# Patient Record
Sex: Male | Born: 1965
Health system: Southern US, Community
[De-identification: ages and names within clinical notes are randomized; demographics above are authoritative.]

## PROBLEM LIST (undated history)

## (undated) DIAGNOSIS — K219 Gastro-esophageal reflux disease without esophagitis: Secondary | ICD-10-CM

## (undated) DIAGNOSIS — J45909 Unspecified asthma, uncomplicated: Secondary | ICD-10-CM

## (undated) DIAGNOSIS — F32A Depression, unspecified: Secondary | ICD-10-CM

## (undated) DIAGNOSIS — T7840XA Allergy, unspecified, initial encounter: Secondary | ICD-10-CM

## (undated) DIAGNOSIS — R569 Unspecified convulsions: Secondary | ICD-10-CM

## (undated) DIAGNOSIS — M109 Gout, unspecified: Secondary | ICD-10-CM

## (undated) DIAGNOSIS — I341 Nonrheumatic mitral (valve) prolapse: Secondary | ICD-10-CM

## (undated) DIAGNOSIS — M1990 Unspecified osteoarthritis, unspecified site: Secondary | ICD-10-CM

## (undated) DIAGNOSIS — E785 Hyperlipidemia, unspecified: Secondary | ICD-10-CM

## (undated) DIAGNOSIS — E119 Type 2 diabetes mellitus without complications: Secondary | ICD-10-CM

## (undated) DIAGNOSIS — I10 Essential (primary) hypertension: Secondary | ICD-10-CM

## (undated) DIAGNOSIS — F419 Anxiety disorder, unspecified: Secondary | ICD-10-CM

## (undated) DIAGNOSIS — C801 Malignant (primary) neoplasm, unspecified: Secondary | ICD-10-CM

## (undated) DIAGNOSIS — M549 Dorsalgia, unspecified: Secondary | ICD-10-CM

## (undated) HISTORY — PX: COLONOSCOPY: SHX174

## (undated) HISTORY — PX: KNEE SURGERY: SHX244

## (undated) HISTORY — PX: SHOULDER SURGERY: SHX246

## (undated) HISTORY — DX: Unspecified osteoarthritis, unspecified site: M19.90

## (undated) HISTORY — DX: Malignant (primary) neoplasm, unspecified: C80.1

## (undated) HISTORY — DX: Gastro-esophageal reflux disease without esophagitis: K21.9

## (undated) HISTORY — DX: Unspecified asthma, uncomplicated: J45.909

## (undated) HISTORY — DX: Essential (primary) hypertension: I10

## (undated) HISTORY — DX: Type 2 diabetes mellitus without complications: E11.9

## (undated) HISTORY — PX: BACK SURGERY: SHX140

## (undated) HISTORY — DX: Hyperlipidemia, unspecified: E78.5

## (undated) HISTORY — DX: Unspecified convulsions: R56.9

## (undated) HISTORY — DX: Gout, unspecified: M10.9

## (undated) HISTORY — DX: Allergy, unspecified, initial encounter: T78.40XA

## (undated) HISTORY — PX: POLYPECTOMY: SHX149

## (undated) HISTORY — DX: Anxiety disorder, unspecified: F41.9

## (undated) HISTORY — DX: Depression, unspecified: F32.A

---

## 1999-03-10 ENCOUNTER — Encounter: Payer: Self-pay | Admitting: Emergency Medicine

## 1999-03-10 ENCOUNTER — Emergency Department (HOSPITAL_COMMUNITY): Admission: EM | Admit: 1999-03-10 | Discharge: 1999-03-10 | Payer: Self-pay | Admitting: Emergency Medicine

## 1999-11-11 ENCOUNTER — Encounter: Payer: Self-pay | Admitting: Emergency Medicine

## 1999-11-11 ENCOUNTER — Emergency Department (HOSPITAL_COMMUNITY): Admission: EM | Admit: 1999-11-11 | Discharge: 1999-11-11 | Payer: Self-pay | Admitting: Emergency Medicine

## 2001-05-24 ENCOUNTER — Emergency Department (HOSPITAL_COMMUNITY): Admission: EM | Admit: 2001-05-24 | Discharge: 2001-05-24 | Payer: Self-pay

## 2001-10-01 ENCOUNTER — Ambulatory Visit (HOSPITAL_COMMUNITY): Admission: RE | Admit: 2001-10-01 | Discharge: 2001-10-01 | Payer: Self-pay | Admitting: Family Medicine

## 2001-10-01 ENCOUNTER — Encounter: Payer: Self-pay | Admitting: Family Medicine

## 2003-01-29 ENCOUNTER — Ambulatory Visit (HOSPITAL_COMMUNITY): Admission: RE | Admit: 2003-01-29 | Discharge: 2003-01-29 | Payer: Self-pay | Admitting: Neurosurgery

## 2003-05-17 ENCOUNTER — Emergency Department (HOSPITAL_COMMUNITY): Admission: EM | Admit: 2003-05-17 | Discharge: 2003-05-17 | Payer: Self-pay | Admitting: *Deleted

## 2003-10-02 ENCOUNTER — Emergency Department (HOSPITAL_COMMUNITY): Admission: EM | Admit: 2003-10-02 | Discharge: 2003-10-02 | Payer: Self-pay | Admitting: Emergency Medicine

## 2003-10-12 ENCOUNTER — Emergency Department (HOSPITAL_COMMUNITY): Admission: EM | Admit: 2003-10-12 | Discharge: 2003-10-12 | Payer: Self-pay | Admitting: Emergency Medicine

## 2004-02-26 ENCOUNTER — Emergency Department (HOSPITAL_COMMUNITY): Admission: EM | Admit: 2004-02-26 | Discharge: 2004-02-26 | Payer: Self-pay | Admitting: *Deleted

## 2004-03-17 ENCOUNTER — Encounter: Admission: RE | Admit: 2004-03-17 | Discharge: 2004-03-17 | Payer: Self-pay | Admitting: Family Medicine

## 2004-07-12 ENCOUNTER — Ambulatory Visit (HOSPITAL_COMMUNITY): Admission: RE | Admit: 2004-07-12 | Discharge: 2004-07-12 | Payer: Self-pay | Admitting: Anesthesiology

## 2004-09-07 ENCOUNTER — Emergency Department (HOSPITAL_COMMUNITY): Admission: EM | Admit: 2004-09-07 | Discharge: 2004-09-07 | Payer: Self-pay | Admitting: Emergency Medicine

## 2004-11-20 ENCOUNTER — Ambulatory Visit (HOSPITAL_COMMUNITY): Admission: RE | Admit: 2004-11-20 | Discharge: 2004-11-20 | Payer: Self-pay | Admitting: Neurosurgery

## 2004-11-30 ENCOUNTER — Encounter: Admission: RE | Admit: 2004-11-30 | Discharge: 2004-11-30 | Payer: Self-pay | Admitting: Neurosurgery

## 2004-12-18 ENCOUNTER — Ambulatory Visit (HOSPITAL_COMMUNITY): Admission: RE | Admit: 2004-12-18 | Discharge: 2004-12-19 | Payer: Self-pay | Admitting: Neurosurgery

## 2007-05-26 ENCOUNTER — Ambulatory Visit (HOSPITAL_COMMUNITY): Admission: RE | Admit: 2007-05-26 | Discharge: 2007-05-26 | Payer: Self-pay | Admitting: Anesthesiology

## 2007-06-28 ENCOUNTER — Encounter: Admission: RE | Admit: 2007-06-28 | Discharge: 2007-06-28 | Payer: Self-pay | Admitting: Neurosurgery

## 2007-11-13 ENCOUNTER — Inpatient Hospital Stay (HOSPITAL_COMMUNITY): Admission: RE | Admit: 2007-11-13 | Discharge: 2007-11-15 | Payer: Self-pay | Admitting: Neurosurgery

## 2008-03-10 ENCOUNTER — Encounter: Admission: RE | Admit: 2008-03-10 | Discharge: 2008-03-10 | Payer: Self-pay | Admitting: Neurosurgery

## 2010-03-25 ENCOUNTER — Encounter: Payer: Self-pay | Admitting: Neurosurgery

## 2010-03-26 ENCOUNTER — Encounter: Payer: Self-pay | Admitting: Orthopedic Surgery

## 2010-06-21 ENCOUNTER — Emergency Department (HOSPITAL_COMMUNITY): Payer: BC Managed Care – PPO

## 2010-06-21 ENCOUNTER — Emergency Department (HOSPITAL_COMMUNITY)
Admission: EM | Admit: 2010-06-21 | Discharge: 2010-06-22 | Disposition: A | Discharge status: Home / Self Care | Payer: BC Managed Care – PPO | Attending: Emergency Medicine | Admitting: Emergency Medicine

## 2010-06-21 DIAGNOSIS — F29 Unspecified psychosis not due to a substance or known physiological condition: Secondary | ICD-10-CM | POA: Insufficient documentation

## 2010-06-21 DIAGNOSIS — S0990XA Unspecified injury of head, initial encounter: Secondary | ICD-10-CM | POA: Insufficient documentation

## 2010-06-21 DIAGNOSIS — T148XXA Other injury of unspecified body region, initial encounter: Secondary | ICD-10-CM | POA: Insufficient documentation

## 2010-06-21 DIAGNOSIS — R296 Repeated falls: Secondary | ICD-10-CM | POA: Insufficient documentation

## 2010-06-21 DIAGNOSIS — M542 Cervicalgia: Secondary | ICD-10-CM | POA: Insufficient documentation

## 2010-06-21 DIAGNOSIS — IMO0002 Reserved for concepts with insufficient information to code with codable children: Secondary | ICD-10-CM | POA: Insufficient documentation

## 2010-06-21 DIAGNOSIS — M545 Low back pain, unspecified: Secondary | ICD-10-CM | POA: Insufficient documentation

## 2010-06-21 DIAGNOSIS — Y92009 Unspecified place in unspecified non-institutional (private) residence as the place of occurrence of the external cause: Secondary | ICD-10-CM | POA: Insufficient documentation

## 2010-06-21 DIAGNOSIS — F3289 Other specified depressive episodes: Secondary | ICD-10-CM | POA: Insufficient documentation

## 2010-06-21 DIAGNOSIS — R55 Syncope and collapse: Secondary | ICD-10-CM | POA: Insufficient documentation

## 2010-06-21 DIAGNOSIS — F329 Major depressive disorder, single episode, unspecified: Secondary | ICD-10-CM | POA: Insufficient documentation

## 2010-06-21 DIAGNOSIS — R51 Headache: Secondary | ICD-10-CM | POA: Insufficient documentation

## 2010-06-21 LAB — URINALYSIS, ROUTINE W REFLEX MICROSCOPIC: Protein, ur: NEGATIVE mg/dL

## 2010-06-21 LAB — D-DIMER, QUANTITATIVE: D-Dimer, Quant: <0.22 ug/mL-FEU (ref 0.00–0.48)

## 2010-06-21 LAB — DIFFERENTIAL
Basophils Absolute: 0 10*3/uL (ref 0.0–0.1)
Basophils Relative: 0 % (ref 0–1)
Eosinophils Absolute: 0.1 10*3/uL (ref 0.0–0.7)
Monocytes Absolute: 0.9 10*3/uL (ref 0.1–1.0)
Monocytes Relative: 9 % (ref 3–12)

## 2010-06-21 LAB — POCT CARDIAC MARKERS
CKMB, poc: <1 ng/mL — ABNORMAL LOW (ref 1.0–8.0)
Myoglobin, poc: 101 ng/mL (ref 12–200)
Troponin i, poc: <0.05 ng/mL (ref 0.00–0.09)

## 2010-06-21 LAB — BASIC METABOLIC PANEL
Chloride: 102 mEq/L (ref 96–112)
Potassium: 3.9 mEq/L (ref 3.5–5.1)
Sodium: 139 mEq/L (ref 135–145)

## 2010-06-21 LAB — CBC
Hemoglobin: 14.8 g/dL (ref 13.0–17.0)
MCV: 86.2 fL (ref 78.0–100.0)
Platelets: 269 10*3/uL (ref 150–400)

## 2010-06-21 LAB — ETHANOL: Alcohol, Ethyl (B): <5 mg/dL (ref 0–10)

## 2010-06-23 LAB — URINE CULTURE
Colony Count: NO GROWTH
Culture: NO GROWTH

## 2010-07-17 NOTE — Op Note (Signed)
NAME:  Robert Wilkerson, Robert Wilkerson NO.:  [id number]   MEDICAL RECORD NO.:  [id number]          PATIENT TYPE:  INP   LOCATION:  3030                         FACILITY:  [facility]   PHYSICIAN:  Kathaleen Maser. Pool, M.D.    DATE OF BIRTH:  [birth year]   DATE OF PROCEDURE:  DATE OF DISCHARGE:                               OPERATIVE REPORT   PREOPERATIVE DIAGNOSIS:  L5-S1 degenerative disk disease with foraminal  stenosis.   POSTOPERATIVE DIAGNOSIS:  L5-S1 degenerative disk disease with foraminal  stenosis.   PROCEDURES NOTE:  L5-S1 decompressive laminectomy and L5-S1  decompressive foraminotomies, more than will be required for simple  interbody fusion alone.  L5-S1 posterior lumbar body fusion utilizing  tangent interbody allograft wedge, Telamon interbody PEEK cage and local  autografting.  L5-S1 posterolateral arthrodesis utilizing nonsegmental  pedicle screw instrumentation and local autografting.   SURGEON:  Kathaleen Maser. Pool, MD   ASSISTANT:  Donalee Citrin, MD   ANESTHESIA:  General endotracheal.   HISTORY:  Robert Wilkerson is a 45 year old male with a history of severe back  and right lower extremity pain failing all conservative management.  Workup demonstrates evidence of significant disk degeneration and facet  arthropathy with marked right-sided L5-S1 foraminal stenosis.  The  patient has been counseled as to his options.  As any type of  decompressive procedure would be destabilizing further, we have decided  to proceed with both decompression and fusion at the L5-S1 level in  hopes of improving his symptoms.   OPERATIVE NOTE:  The patient was brought to the operating room, placed  on operating table in a supine position.  After an adequate level of  anesthesia achieved, the patient was placed prone on to [place] frame.  Appropriately padded the patient's lumbar region, prepped and draped  sterilely.  A #10 blade was used to make a curvilinear skin incision  overlying the L5-S1 levels.   This was carried down sharply in midline.  Subperiosteal dissection was then performed exposing the lamina and  facet joints at L5-S1 as well as transverse processes of L5-S1.  Deep  self-retaining retractor was placed.  Intraoperative x-ray was taken and  the level was confirmed.  Decompressive laminectomy was then performed  using Leksell rongeurs, Kerrison rongeurs, and high-speed drill to  remove the entire lamina of L5, entire inferior facet of L5, and  superior facet of S1 bilaterally.  Partial superior laminectomy of S1  was also performed.  All bone was cleaned using later autografting.  Ligamentum flavum was then elevated and resected in the usual fashion  using Kerrison rongeurs.  Wide decompressive foraminotomies were  completed along the course of the exiting L5-S1 nerve roots bilaterally  with particular attention being placed on the right-sided L5 foramen.  Epidural venous plexus was coagulated and cut.  A distractor was left in  the patient's left side.  Thecal sac and nerve roots were protected.  Disk space was incised and diskectomy was performed.  The procedure was  then repeated on the contralateral side.  Disk space was then  sequentially dilated up to 8 mm, and  an 8-mm distractor was left in the  patient's left side.  Thecal sac and nerve roots were protected on the  right side.  Disk space was then reamed and then cut with 8-mm tangent  instruments.  Soft tissue was then removed from the interspace.  An 8 x  26 mm Telamon cage packed with morcelized autograft was then packed into  place and recessed approximately 3 mm from the posterior cortical margin  of L5.  Distractor was removed from the patient's left side.  Thecal sac  and nerve roots were protected in the left side.  Disk space was then  reamed and cut with an 8-mm tangent instruments.  Soft tissue was then  removed from the interspace.  Disk space was further curettaged.  Morcelized autograft was then packed  in the interspace.  An 8 x 26 mm  tangent wedge was then packed on the left side and recessed  approximately 2 mm from the posterior cortical margin of L5.  Pedicles  at L5-S1 were then identified using surface landmarks and intraoperative  fluoroscopy.  Superficial bone overlying the pedicle was then removed  using high-speed drill.  Each pedicle was then probed using pedicle awl.  Pedicle awl track was then tapped with a 5.25-mm screw tapper.  Each  screw tap hole was probed and found to be solid within bone.  A 6.75 x  45 mm radius screws were placed bilaterally at L5.  A 6.75 x 35 mm  screws were placed bilaterally at S1.  Transverse processes and sacral  ala were then decorticated using high-speed drill.  Morcelized autograft  was packed posterolaterally for later fusion.  Short-segment titanium  rods were then placed through the screw holes at L5-S1.  Locking caps  were placed through the screw.  Locking caps were then engaged and were  then constructed with compression.  Final images revealed good position  of bone grafts and hardware with proper operative level and normal  alignment of spine.  Wound was then irrigated one final time.  Hemostasis was ensured with the bipolar electrocautery.  Gelfoam was  placed topically over the epidural space.  Medium Hemovac drain was left  at operative sites.  Wound was then closed in layers with Vicryl suture.  Steri-Strips and sterile dressings were applied.  There were no  complications.  He tolerated the procedure well and he returns to  recovery room postoperatively.           ______________________________  Kathaleen Maser Pool, M.D.     HAP/MEDQ  D:  11/13/2007  T:  11/14/2007  Job:  [id number]

## 2010-12-05 LAB — BASIC METABOLIC PANEL
Calcium: 9.8
GFR calc Af Amer: >60
GFR calc non Af Amer: >60
Potassium: 4.5
Sodium: 137

## 2010-12-05 LAB — DIFFERENTIAL
Basophils Absolute: 0
Eosinophils Relative: 0
Lymphocytes Relative: 24
Lymphs Abs: 2
Monocytes Absolute: 0.6
Neutro Abs: 5.6

## 2010-12-05 LAB — CBC
HCT: 43.2
Hemoglobin: 14.5
RDW: 12.5
WBC: 8.3

## 2010-12-05 LAB — ABO/RH: ABO/RH(D): B POS

## 2010-12-05 LAB — TYPE AND SCREEN: ABO/RH(D): B POS

## 2011-01-23 ENCOUNTER — Emergency Department (HOSPITAL_COMMUNITY)
Admission: EM | Admit: 2011-01-23 | Discharge: 2011-01-24 | Disposition: A | Discharge status: Home / Self Care | Payer: BC Managed Care – PPO | Attending: Emergency Medicine | Admitting: Emergency Medicine

## 2011-01-23 ENCOUNTER — Encounter: Payer: Self-pay | Admitting: Emergency Medicine

## 2011-01-23 ENCOUNTER — Emergency Department (HOSPITAL_COMMUNITY): Payer: BC Managed Care – PPO

## 2011-01-23 DIAGNOSIS — R609 Edema, unspecified: Secondary | ICD-10-CM | POA: Insufficient documentation

## 2011-01-23 DIAGNOSIS — S93409A Sprain of unspecified ligament of unspecified ankle, initial encounter: Secondary | ICD-10-CM | POA: Insufficient documentation

## 2011-01-23 DIAGNOSIS — S93402A Sprain of unspecified ligament of left ankle, initial encounter: Secondary | ICD-10-CM

## 2011-01-23 DIAGNOSIS — X500XXA Overexertion from strenuous movement or load, initial encounter: Secondary | ICD-10-CM | POA: Insufficient documentation

## 2011-01-23 DIAGNOSIS — M25579 Pain in unspecified ankle and joints of unspecified foot: Secondary | ICD-10-CM | POA: Insufficient documentation

## 2011-01-23 MED ORDER — HYDROCODONE-ACETAMINOPHEN 5-325 MG PO TABS: 1.0000 | ORAL_TABLET | ORAL | Status: AC | PRN

## 2011-01-23 MED ORDER — IBUPROFEN 800 MG PO TABS: 800.0000 mg | ORAL_TABLET | Freq: Three times a day (TID) | ORAL | Status: AC

## 2011-01-23 MED ORDER — HYDROCODONE-ACETAMINOPHEN 5-325 MG PO TABS
1.0000 | ORAL_TABLET | Freq: Once | ORAL | Status: AC
  Administered 2011-01-23: 1 via ORAL
  Filled 2011-01-23: qty 1

## 2011-01-23 NOTE — ED Notes (Signed)
Pt states he was hunting today.  Was walking down a trail and didn't see a rut that he stepped into.  Rolled his right ankle.  Minor swelling/bruising noted.

## 2011-01-23 NOTE — ED Provider Notes (Signed)
History     CSN: [id number] Arrival date & time: 01/23/2011  9:54 PM   First MD Initiated Contact with Patient 01/23/11 2312      Chief Complaint  Patient presents with  . Ankle Pain    right     (Consider location/radiation/quality/duration/timing/severity/associated sxs/prior treatment) HPI History provided by pt.   Pt stepped in a ditch while hunting today and foot inverted.  Has 4/10 pain at lateral malleolus and is unable to bear weight.  No paresthesias. H/o avulsion injury of this ankle in past.    History reviewed. No pertinent past medical history.  Past Surgical History  Procedure Date  . Knee surgery   . Shoulder surgery   . Back surgery     History reviewed. No pertinent family history.  History  Substance Use Topics  . Smoking status: Never Smoker   . Smokeless tobacco: Current User  . Alcohol Use: No      Review of Systems  All other systems reviewed and are negative.    Allergies  Penicillins and Sulfa antibiotics  Home Medications   Current Outpatient Rx  Name Route Sig Dispense Refill  . ACETAMINOPHEN 500 MG PO TABS Oral Take 1,000 mg by mouth every 6 (six) hours as needed. PAIN.     Marland Kitchen AMINOCAPROIC ACID 500 MG PO TABS Oral Take 1 g by mouth daily.      . DESVENLAFAXINE SUCCINATE 50 MG PO TB24 Oral Take 50 mg by mouth daily.      Carma Leaven M PLUS PO TABS Oral Take 1 tablet by mouth daily.      Marland Kitchen NAPROXEN SODIUM 220 MG PO TABS Oral Take 220 mg by mouth daily as needed. PAIN/       BP 131/82  Pulse 100  Temp(Src) 98 F (36.7 C) (Oral)  Resp 16  Ht 5\' 10"  (1.778 m)  Wt 216 lb (97.977 kg)  BMI 30.99 kg/m2  SpO2 96%  Physical Exam  Nursing note and vitals reviewed. Constitutional: He is oriented to person, place, and time. He appears well-developed and well-nourished. No distress.  HENT:  Head: Normocephalic and atraumatic.  Eyes:       Normal appearance  Neck: Normal range of motion.  Musculoskeletal:       Edema and tenderness  isolated to right lateral malleolus.  No ecchymosis or other skin changes.  Pain w/ flexion/extension and esp foot inversion/lat. Rotation.  2+ DP pulse.  Distal sensation intact.   Neurological: He is alert and oriented to person, place, and time.  Psychiatric: He has a normal mood and affect. His behavior is normal.    ED Course  Procedures (including critical care time)  Labs Reviewed - No data to display Dg Ankle Complete Right  01/23/2011  *RADIOLOGY REPORT*  Clinical Data: Larey Seat and injured right ankle.  RIGHT ANKLE - COMPLETE 3+ VIEW 01/23/2011:  Comparison: None.  Findings: No evidence of acute fracture or dislocation.  Well corticated ossific density adjacent to the tip of the lateral malleolus.  Ankle mortise intact with well-preserved joint space. Well-preserved bone mineral density.  No visible joint effusion.  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  No acute or significant abnormality.  Original Report Authenticated By: Arnell Sieving, M.D.     1. Sprain of left ankle       MDM  Pt presents w/ inversion injury right ankle.  Xray neg for fx/disclocation.  Results discussed w/ pt.  Will treat conservatively for sprain.  Ortho tech placed in ASO and provided pt w/ crutches.  D/c'd home w/ vicodin/ibuprofen and referral to ortho for persistent/worsening sx.          Otilio Miu, PA 01/24/11 0019  Otilio Miu, PA 01/24/11 [phone]

## 2011-01-23 NOTE — ED Notes (Signed)
Pt states he was hunting today when he "stepped on a rut" and rolled his right ankle.  Reports swelling/bruising.

## 2011-01-23 NOTE — ED Notes (Signed)
Patient transported to X-ray 

## 2011-01-24 NOTE — ED Provider Notes (Signed)
Medical screening examination/treatment/procedure(s) were performed by non-physician practitioner and as supervising physician I was immediately available for consultation/collaboration.    Nelia Shi, MD 01/24/11 [phone]

## 2011-07-21 ENCOUNTER — Encounter (HOSPITAL_COMMUNITY): Payer: Self-pay | Admitting: Family Medicine

## 2011-07-21 ENCOUNTER — Emergency Department (HOSPITAL_COMMUNITY)
Admission: EM | Admit: 2011-07-21 | Discharge: 2011-07-21 | Disposition: A | Discharge status: Home / Self Care | Payer: BC Managed Care – PPO | Attending: Emergency Medicine | Admitting: Emergency Medicine

## 2011-07-21 DIAGNOSIS — IMO0002 Reserved for concepts with insufficient information to code with codable children: Secondary | ICD-10-CM | POA: Insufficient documentation

## 2011-07-21 DIAGNOSIS — M549 Dorsalgia, unspecified: Secondary | ICD-10-CM | POA: Insufficient documentation

## 2011-07-21 DIAGNOSIS — X58XXXA Exposure to other specified factors, initial encounter: Secondary | ICD-10-CM | POA: Insufficient documentation

## 2011-07-21 DIAGNOSIS — S39012A Strain of muscle, fascia and tendon of lower back, initial encounter: Secondary | ICD-10-CM

## 2011-07-21 HISTORY — DX: Nonrheumatic mitral (valve) prolapse: I34.1

## 2011-07-21 HISTORY — DX: Dorsalgia, unspecified: M54.9

## 2011-07-21 MED ORDER — SODIUM CHLORIDE 0.9 % IV BOLUS (SEPSIS)
1000.0000 mL | Freq: Once | INTRAVENOUS | Status: AC
  Administered 2011-07-21: 1000 mL via INTRAVENOUS

## 2011-07-21 MED ORDER — DIAZEPAM 5 MG PO TABS
5.0000 mg | ORAL_TABLET | Freq: Once | ORAL | Status: AC
  Administered 2011-07-21: 5 mg via ORAL
  Filled 2011-07-21: qty 1

## 2011-07-21 MED ORDER — HYDROCODONE-ACETAMINOPHEN 5-325 MG PO TABS: 1.0000 | ORAL_TABLET | Freq: Four times a day (QID) | ORAL | Status: AC | PRN

## 2011-07-21 MED ORDER — HYDROCODONE-ACETAMINOPHEN 5-325 MG PO TABS
1.0000 | ORAL_TABLET | Freq: Once | ORAL | Status: AC
  Administered 2011-07-21: 1 via ORAL
  Filled 2011-07-21: qty 1

## 2011-07-21 MED ORDER — IBUPROFEN 800 MG PO TABS: 800.0000 mg | ORAL_TABLET | Freq: Three times a day (TID) | ORAL | Status: AC

## 2011-07-21 MED ORDER — HYDROMORPHONE HCL PF 1 MG/ML IJ SOLN
1.0000 mg | Freq: Once | INTRAMUSCULAR | Status: AC
  Administered 2011-07-21: 1 mg via INTRAVENOUS
  Filled 2011-07-21: qty 1

## 2011-07-21 MED ORDER — IBUPROFEN 800 MG PO TABS
800.0000 mg | ORAL_TABLET | Freq: Once | ORAL | Status: AC
Start: 2011-07-21 — End: 2011-07-21
  Administered 2011-07-21: 800 mg via ORAL
  Filled 2011-07-21: qty 1

## 2011-07-21 MED ORDER — DIAZEPAM 5 MG PO TABS: 5.0000 mg | ORAL_TABLET | Freq: Three times a day (TID) | ORAL | Status: AC | PRN

## 2011-07-21 NOTE — ED Notes (Signed)
Per pt was doing back exercises a few days ago and pulled his back. sts mid back pain radiating down right leg. Hx of back pain.

## 2011-07-21 NOTE — ED Provider Notes (Signed)
History  Scribed for Gerhard Munch, MD, the patient was seen in room STRE8/STRE8. This chart was scribed by Candelaria Stagers. The patient's care started at 1:47 PM    CSN: [id number]  Arrival date & time 07/21/11  1315   None     Chief Complaint  Patient presents with  . Back Pain    Back Pain This is a recurrent problem. The current episode started in the past 7 days. The problem occurs constantly. The problem is unchanged. The pain is present in the lumbar spine. The pain radiates to the right thigh. The pain is moderate. The symptoms are aggravated by lying down and standing. Stiffness is present all day. Pertinent negatives include no fever or weakness. He has tried nothing for the symptoms. The treatment provided no relief.   Robert Wilkerson is a 46 y.o. male who presents to the Emergency Department complaining of lower mid back pain that radiates down the back of his right leg to the knee after aggravating it two days ago.  Pt has had previous back surgery.  He denies loss of sensation, fever, nausea, diarrhea, or vomiting.  He is able to walk with pain.  Walking and sitting up right makes the pain worse.   Past Medical History  Diagnosis Date  . Mitral valve prolapse   . Back pain     Past Surgical History  Procedure Date  . Knee surgery   . Shoulder surgery   . Back surgery     History reviewed. No pertinent family history.  History  Substance Use Topics  . Smoking status: Never Smoker   . Smokeless tobacco: Current User  . Alcohol Use: No      Review of Systems  Constitutional: Negative for fever and chills.  Respiratory: Negative for shortness of breath.   Gastrointestinal: Negative for nausea, vomiting and diarrhea.  Musculoskeletal: Positive for back pain.  Neurological: Negative for weakness.  Psychiatric/Behavioral: Negative for confusion.    Allergies  Penicillins and Sulfa antibiotics  Home Medications   Current Outpatient Rx  Name Route Sig  Dispense Refill  . ACETAMINOPHEN 500 MG PO TABS Oral Take 1,000 mg by mouth every 6 (six) hours as needed. PAIN.     Carma Leaven M PLUS PO TABS Oral Take 1 tablet by mouth daily.      Marland Kitchen NAPROXEN SODIUM 220 MG PO TABS Oral Take 220 mg by mouth daily as needed. PAIN/     . OXYMORPHONE HCL ER 7.5 MG PO TB12 Oral Take 7.5 mg by mouth every 12 (twelve) hours.    . SERTRALINE HCL 100 MG PO TABS Oral Take 100 mg by mouth daily.      BP 124/72  Pulse 89  Temp(Src) 98.1 F (36.7 C) (Oral)  Resp 16  SpO2 97%  Physical Exam  Nursing note and vitals reviewed. Constitutional: He is oriented to person, place, and time. He appears well-developed and well-nourished. No distress.  HENT:  Head: Normocephalic and atraumatic.  Eyes: EOM are normal.  Neck: Neck supple. No tracheal deviation present.  Cardiovascular: Normal rate.   Pulmonary/Chest: Effort normal. No respiratory distress.  Musculoskeletal: Normal range of motion.       Normal sensation and strength of legs bilaterally.  Tenderness on palpation of the paraspinal muscles on the right side.    Neurological: He is alert and oriented to person, place, and time.  Skin: Skin is warm and dry.  Psychiatric: He has a normal mood and affect. His  behavior is normal.    ED Course  Procedures      COORDINATION OF CARE:  1:50 PM Discussed course of care including f/u.     Labs Reviewed - No data to display No results found.   No diagnosis found.   3:11 PM Patient notes no improvement with PO meds.  IVF meds / fluids ordered MDM  I personally performed the services described in this documentation, which was scribed in my presence. The recorded information has been reviewed and considered.  This generally well 46 year old male now presents with ongoing low back pain.  On exam the patient is uncomfortable appearing, though he has no neurologic deficits, nor does he describe any concerning aspects of his low back strain.  The patient  required IV medication for analgesia, but otherwise remained unremarkable throughout his emergency department stay. He was discharged with oral medications, and orthopedics followup  Gerhard Munch, MD 07/21/11 [phone]

## 2011-10-02 ENCOUNTER — Other Ambulatory Visit: Payer: Self-pay | Admitting: Physician Assistant

## 2011-10-02 DIAGNOSIS — M545 Low back pain: Secondary | ICD-10-CM

## 2011-10-06 ENCOUNTER — Ambulatory Visit
Admission: RE | Admit: 2011-10-06 | Discharge: 2011-10-06 | Disposition: A | Discharge status: Home / Self Care | Payer: BC Managed Care – PPO | Source: Ambulatory Visit | Attending: Physician Assistant | Admitting: Physician Assistant

## 2011-10-06 DIAGNOSIS — M545 Low back pain: Secondary | ICD-10-CM

## 2012-02-12 ENCOUNTER — Emergency Department (HOSPITAL_COMMUNITY): Payer: BC Managed Care – PPO

## 2012-02-12 ENCOUNTER — Emergency Department (HOSPITAL_COMMUNITY)
Admission: EM | Admit: 2012-02-12 | Discharge: 2012-02-12 | Disposition: A | Discharge status: Home / Self Care | Payer: BC Managed Care – PPO | Attending: Emergency Medicine | Admitting: Emergency Medicine

## 2012-02-12 ENCOUNTER — Encounter (HOSPITAL_COMMUNITY): Payer: Self-pay | Admitting: Emergency Medicine

## 2012-02-12 DIAGNOSIS — W19XXXA Unspecified fall, initial encounter: Secondary | ICD-10-CM

## 2012-02-12 DIAGNOSIS — Z79899 Other long term (current) drug therapy: Secondary | ICD-10-CM | POA: Insufficient documentation

## 2012-02-12 DIAGNOSIS — Y9389 Activity, other specified: Secondary | ICD-10-CM | POA: Insufficient documentation

## 2012-02-12 DIAGNOSIS — S46909A Unspecified injury of unspecified muscle, fascia and tendon at shoulder and upper arm level, unspecified arm, initial encounter: Secondary | ICD-10-CM | POA: Insufficient documentation

## 2012-02-12 DIAGNOSIS — S4980XA Other specified injuries of shoulder and upper arm, unspecified arm, initial encounter: Secondary | ICD-10-CM | POA: Insufficient documentation

## 2012-02-12 DIAGNOSIS — Z9889 Other specified postprocedural states: Secondary | ICD-10-CM | POA: Insufficient documentation

## 2012-02-12 DIAGNOSIS — IMO0002 Reserved for concepts with insufficient information to code with codable children: Secondary | ICD-10-CM | POA: Insufficient documentation

## 2012-02-12 DIAGNOSIS — Z8679 Personal history of other diseases of the circulatory system: Secondary | ICD-10-CM | POA: Insufficient documentation

## 2012-02-12 DIAGNOSIS — Y929 Unspecified place or not applicable: Secondary | ICD-10-CM | POA: Insufficient documentation

## 2012-02-12 DIAGNOSIS — W1789XA Other fall from one level to another, initial encounter: Secondary | ICD-10-CM | POA: Insufficient documentation

## 2012-02-12 DIAGNOSIS — M549 Dorsalgia, unspecified: Secondary | ICD-10-CM

## 2012-02-12 MED ORDER — CYCLOBENZAPRINE HCL 10 MG PO TABS
5.0000 mg | ORAL_TABLET | Freq: Once | ORAL | Status: AC
  Administered 2012-02-12: 5 mg via ORAL
  Filled 2012-02-12: qty 1

## 2012-02-12 MED ORDER — OXYCODONE-ACETAMINOPHEN 5-325 MG PO TABS: 1.0000 | ORAL_TABLET | Freq: Four times a day (QID) | ORAL | Status: DC | PRN

## 2012-02-12 MED ORDER — OXYCODONE-ACETAMINOPHEN 5-325 MG PO TABS
2.0000 | ORAL_TABLET | Freq: Once | ORAL | Status: AC
  Administered 2012-02-12: 2 via ORAL
  Filled 2012-02-12: qty 2

## 2012-02-12 NOTE — ED Provider Notes (Signed)
Medical screening examination/treatment/procedure(s) were performed by non-physician practitioner and as supervising physician I was immediately available for consultation/collaboration.  Devoria Albe, MD, Armando Gang   Ward Givens, MD 02/12/12 [phone]

## 2012-02-12 NOTE — ED Provider Notes (Addendum)
History     CSN: [id number]  Arrival date & time 02/12/12  2141   First MD Initiated Contact with Patient 02/12/12 2204      Chief Complaint  Patient presents with  . Fall    (Consider location/radiation/quality/duration/timing/severity/associated sxs/prior treatment) HPI  46 year old male with history of chronic back pain presents for evaluation of a fall. Patient reports he was trying to get off from a deer stand 15 feet off the ground when he misstepped, fell, and landed on his back on ground with pine needle.  States he landed directly on his back, without any headache loss of consciousness.  He denies any previous precipitating symptoms prior to the fall. He lays in the ground for about 40 seconds, able to stand up, walk, and found his son who lives nearby.  He was brought to the ED by family member for further evaluation. He currently complaining of achy pain between his shoulder blade, and his low back. Described pain as a throbbing sensation, most significant to low back, nonradiating, rate a 6/10 and worsening with movement.  He denies headache, neck pain, chest pain, shortness of breath, abdominal pain, or pain to any of the extremities. He does endorse mild tingling sensation to the tip of his finger which has improved. No nausea, vomiting. He usually takes Opana, and Ativan although his pain, and currently being cared for through the pain management Center. He has not taken any pain medication since the injury, several hours ago. Past Medical History  Diagnosis Date  . Mitral valve prolapse   . Back pain     Past Surgical History  Procedure Date  . Knee surgery   . Shoulder surgery   . Back surgery     No family history on file.  History  Substance Use Topics  . Smoking status: Never Smoker   . Smokeless tobacco: Current User  . Alcohol Use: No      Review of Systems  Constitutional: Negative for fever.  HENT: Negative for neck pain.   Cardiovascular:  Negative for chest pain.  Gastrointestinal: Negative for abdominal pain.  Musculoskeletal: Positive for back pain.  Skin: Negative for rash and wound.  Neurological: Negative for numbness and headaches.    Allergies  Penicillins and Sulfa antibiotics  Home Medications   Current Outpatient Rx  Name  Route  Sig  Dispense  Refill  . ACETAMINOPHEN 500 MG PO TABS   Oral   Take 1,000 mg by mouth every 6 (six) hours as needed. PAIN.          Carma Leaven M PLUS PO TABS   Oral   Take 1 tablet by mouth daily.           Marland Kitchen NAPROXEN SODIUM 220 MG PO TABS   Oral   Take 220 mg by mouth daily as needed. PAIN/          . OXYMORPHONE HCL ER 7.5 MG PO TB12   Oral   Take 7.5 mg by mouth every 12 (twelve) hours.         . SERTRALINE HCL 100 MG PO TABS   Oral   Take 100 mg by mouth daily.           BP 134/95  Pulse 103  Temp 97.7 F (36.5 C) (Oral)  Resp 18  Ht 5\' 10"  (1.778 m)  Wt 219 lb (99.338 kg)  BMI 31.42 kg/m2  SpO2 96%  Physical Exam  Nursing note and vitals reviewed. Constitutional:  He is oriented to person, place, and time. He appears well-developed and well-nourished. No distress.       Awake, alert, nontoxic appearance  HENT:  Head: Atraumatic.  Mouth/Throat: Oropharynx is clear and moist.  Eyes: Conjunctivae normal are normal. Right eye exhibits no discharge. Left eye exhibits no discharge.  Neck: Normal range of motion. Neck supple.       Soft collar in place. No C-spine tenderness, no step-off  Cardiovascular: Normal rate and regular rhythm.   Pulmonary/Chest: Effort normal. No respiratory distress. He exhibits no tenderness.  Abdominal: Soft. There is no tenderness. There is no rebound.  Musculoskeletal: He exhibits tenderness (Mild tenderness to mid spine and L. spine on palpation without step-off, or overlying skin changes. No pain to all 4 extremities.). He exhibits no edema.       Left elbow: Normal.       Right hip: Normal.       Left hip: Normal.        Right ankle: Normal.       Left ankle: Normal.       Cervical back: Normal.       Thoracic back: He exhibits decreased range of motion, tenderness and bony tenderness. He exhibits no swelling, no edema, no deformity and no laceration.       Lumbar back: He exhibits decreased range of motion, tenderness and bony tenderness. He exhibits no swelling, no edema, no deformity and no laceration.       Right foot: Normal.       Left foot: Normal.       ROM appears intact, no obvious focal weakness  Neurological: He is alert and oriented to person, place, and time. He has normal reflexes.  Skin: Skin is warm and dry. No rash noted.  Psychiatric: He has a normal mood and affect.    ED Course  Procedures (including critical care time)  Labs Reviewed - No data to display No results found.   No diagnosis found.  Results for orders placed during the hospital encounter of 06/21/10  DIFFERENTIAL      Component Value Range   Neutrophils Relative 54  43 - 77 %   Neutro Abs 5.6  1.7 - 7.7 K/uL   Lymphocytes Relative 36  12 - 46 %   Lymphs Abs 3.7  0.7 - 4.0 K/uL   Monocytes Relative 9  3 - 12 %   Monocytes Absolute 0.9  0.1 - 1.0 K/uL   Eosinophils Relative 1  0 - 5 %   Eosinophils Absolute 0.1  0.0 - 0.7 K/uL   Basophils Relative 0  0 - 1 %   Basophils Absolute 0.0  0.0 - 0.1 K/uL  CBC      Component Value Range   WBC 10.4  4.0 - 10.5 K/uL   RBC 4.87  4.22 - 5.81 MIL/uL   Hemoglobin 14.8  13.0 - 17.0 g/dL   HCT 41.3  24.4 - 01.0 %   MCV 86.2  78.0 - 100.0 fL   MCH 30.4  26.0 - 34.0 pg   MCHC 35.2  30.0 - 36.0 g/dL   RDW 27.2  53.6 - 64.4 %   Platelets 269  150 - 400 K/uL  D-DIMER, QUANTITATIVE      Component Value Range   D-Dimer, Quant    0.00 - 0.48 ug/mL-FEU   Value: <0.22            AT THE INHOUSE ESTABLISHED CUTOFF  VALUE OF 0.48 ug/mL FEU,     THIS ASSAY HAS BEEN DOCUMENTED     IN THE LITERATURE TO HAVE     A SENSITIVITY AND NEGATIVE     PREDICTIVE VALUE OF AT LEAST      98 TO 99%.  THE TEST RESULT     SHOULD BE CORRELATED WITH     AN ASSESSMENT OF THE CLINICAL     PROBABILITY OF DVT / VTE.  ETHANOL      Component Value Range   Alcohol, Ethyl (B)    0 - 10 mg/dL   Value: <5            LOWEST DETECTABLE LIMIT FOR     SERUM ALCOHOL IS 5 mg/dL     FOR MEDICAL PURPOSES ONLY  BASIC METABOLIC PANEL      Component Value Range   Sodium 139  135 - 145 mEq/L   Potassium 3.9  3.5 - 5.1 mEq/L   Chloride 102  96 - 112 mEq/L   CO2 29  19 - 32 mEq/L   Glucose, Bld 90  70 - 99 mg/dL   BUN 16  6 - 23 mg/dL   Creatinine, Ser 1.61  0.4 - 1.5 mg/dL   Calcium 9.6  8.4 - 09.6 mg/dL   GFR calc non Af Amer >60  >60 mL/min   GFR calc Af Amer    >60 mL/min   Value: >60            The eGFR has been calculated     using the MDRD equation.     This calculation has not been     validated in all clinical     situations.     eGFR's persistently     <60 mL/min signify     possible Chronic Kidney Disease.  POCT CARDIAC MARKERS      Component Value Range   Myoglobin, poc 101  12 - 200 ng/mL   CKMB, poc <1.0 (*) 1.0 - 8.0 ng/mL   Troponin i, poc <0.05  0.00 - 0.09 ng/mL   Comment       Value:            TROPONIN VALUES IN THE RANGE     OF 0.00-0.09 ng/mL SHOW     NO INDICATION OF     MYOCARDIAL INJURY.                PERSISTENTLY INCREASED TROPONIN     VALUES IN THE RANGE OF 0.10-0.24     ng/mL CAN BE SEEN IN:           -UNSTABLE ANGINA           -CONGESTIVE HEART FAILURE           -MYOCARDITIS           -CHEST TRAUMA           -ARRYHTHMIAS           -LATE PRESENTING MI           -COPD       CLINICAL FOLLOW-UP RECOMMENDED.                TROPONIN VALUES >=0.25 ng/mL     INDICATE POSSIBLE MYOCARDIAL     ISCHEMIA. SERIAL TESTING     RECOMMENDED.  URINALYSIS, ROUTINE W REFLEX MICROSCOPIC      Component Value Range   Color, Urine YELLOW  YELLOW   APPearance CLEAR  CLEAR  Specific Gravity, Urine 1.018  1.005 - 1.030   pH 6.0  5.0 - 8.0   Glucose, UA  NEGATIVE  NEGATIVE mg/dL   Hgb urine dipstick NEGATIVE  NEGATIVE   Bilirubin Urine NEGATIVE  NEGATIVE   Ketones, ur NEGATIVE  NEGATIVE mg/dL   Protein, ur NEGATIVE  NEGATIVE mg/dL   Urobilinogen, UA 0.2  0.0 - 1.0 mg/dL   Nitrite NEGATIVE  NEGATIVE   Leukocytes, UA    NEGATIVE   Value: NEGATIVE MICROSCOPIC NOT DONE ON URINES WITH NEGATIVE PROTEIN, BLOOD, LEUKOCYTES, NITRITE, OR GLUCOSE <1000 mg/dL.  URINE CULTURE      Component Value Range   Specimen Description URINE, RANDOM     Special Requests NONE     Culture  Setup Time [id number]     Colony Count NO GROWTH     Culture NO GROWTH     Report Status 06/23/2010 FINAL     Dg Thoracic Spine 2 View  02/12/2012  *RADIOLOGY REPORT*  Clinical Data: Fall.  Back pain.  THORACIC SPINE - 2 VIEW  Comparison: None.  Findings: Thoracic spinal alignment is anatomic.  No fracture. Vertebral body height is preserved.  Paraspinal lines appear within normal limits.  Multilevel degenerative disc disease. Cervicothoracic junction poorly visualized due to bony overlap with the scapula.  IMPRESSION: No visualized acute osseous abnormality.   Original Report Authenticated By: Andreas Newport, M.D.    Dg Lumbar Spine Complete  02/12/2012  *RADIOLOGY REPORT*  Clinical Data: Fall.  Mid to low back pain.  LUMBAR SPINE - COMPLETE 4+ VIEW  Comparison: MRI 10/06/2011.  Findings: L5-S1 PLIF.  No acute osseous abnormality.  No hardware failure.  No hardware complication.  Adjacent segment degenerative disc disease is present at L4-L5.  Mildly exaggerated thoracolumbar kyphosis.  Mild dextroconvex curvature may be positional. Disc space narrowing is present at L2-L3 and L3-L4 as well.  IMPRESSION: No acute osseous abnormality. Mild to moderate lumbar spondylosis. Posterior lumbar interbody fusion at L5-S1.   Original Report Authenticated By: Andreas Newport, M.D.     1.fall 2. Back pain  MDM   patient had a mechanical fall off from a deer stand, directly on his back.   No significant back pain on exam.  Chest and abdomen nontender.  No head injury or LOC.  Will obtain thoracic and Lspine.  Pain medication given.  Will monitor    11:06 PM History of mid back and low back shows no acute fractures or dislocation. Patient is able to ambulate without assist. Patient states Percocet helps with his pain.  Pt has pain management clinic and has pain contract.  Will give a short course of pain meds and he will notify his provider.  Care discussed with my attending.   BP 134/95  Pulse 103  Temp 97.7 F (36.5 C) (Oral)  Resp 18  Ht 5\' 10"  (1.778 m)  Wt 219 lb (99.338 kg)  BMI 31.42 kg/m2  SpO2 96%  I have reviewed nursing notes and vital signs. I personally reviewed the imaging tests through PACS system  I reviewed available ER/hospitalization records thought the EMR     Fayrene Helper, PA-C 02/12/12 2307  Fayrene Helper, PA-C 02/12/12 2316

## 2012-02-12 NOTE — ED Notes (Signed)
Pt presents to the Ed with a complaint of a fall from a deer stand 15' in the air.  Pt landed on his back.  Pt has limited range of motion.  Pt complains of right hand tingling.  Pt complained of lower back pain.

## 2012-02-12 NOTE — ED Provider Notes (Signed)
Medical screening examination/treatment/procedure(s) were performed by non-physician practitioner and as supervising physician I was immediately available for consultation/collaboration. Devoria Albe, MD, Armando Gang   Ward Givens, MD 02/12/12 [phone]

## 2013-05-19 ENCOUNTER — Ambulatory Visit
Admission: RE | Admit: 2013-05-19 | Discharge: 2013-05-19 | Disposition: A | Discharge status: Home / Self Care | Payer: BC Managed Care – PPO | Source: Ambulatory Visit | Attending: Physician Assistant | Admitting: Physician Assistant

## 2013-05-19 ENCOUNTER — Other Ambulatory Visit: Payer: Self-pay | Admitting: Physician Assistant

## 2013-05-19 DIAGNOSIS — L0390 Cellulitis, unspecified: Secondary | ICD-10-CM

## 2013-07-12 ENCOUNTER — Emergency Department (HOSPITAL_COMMUNITY)
Admission: EM | Admit: 2013-07-12 | Discharge: 2013-07-12 | Disposition: A | Discharge status: Home / Self Care | Payer: BC Managed Care – PPO | Attending: Emergency Medicine | Admitting: Emergency Medicine

## 2013-07-12 ENCOUNTER — Encounter (HOSPITAL_COMMUNITY): Payer: Self-pay | Admitting: Emergency Medicine

## 2013-07-12 DIAGNOSIS — M545 Low back pain, unspecified: Secondary | ICD-10-CM

## 2013-07-12 DIAGNOSIS — IMO0002 Reserved for concepts with insufficient information to code with codable children: Secondary | ICD-10-CM | POA: Insufficient documentation

## 2013-07-12 DIAGNOSIS — Z8679 Personal history of other diseases of the circulatory system: Secondary | ICD-10-CM | POA: Insufficient documentation

## 2013-07-12 DIAGNOSIS — M25559 Pain in unspecified hip: Secondary | ICD-10-CM | POA: Insufficient documentation

## 2013-07-12 DIAGNOSIS — Z880 Allergy status to penicillin: Secondary | ICD-10-CM | POA: Insufficient documentation

## 2013-07-12 DIAGNOSIS — Z79899 Other long term (current) drug therapy: Secondary | ICD-10-CM | POA: Insufficient documentation

## 2013-07-12 MED ORDER — OXYCODONE-ACETAMINOPHEN 5-325 MG PO TABS
2.0000 | ORAL_TABLET | Freq: Once | ORAL | Status: AC
  Administered 2013-07-12: 2 via ORAL
  Filled 2013-07-12: qty 2

## 2013-07-12 MED ORDER — LORAZEPAM 2 MG/ML IJ SOLN
1.0000 mg | Freq: Once | INTRAMUSCULAR | Status: AC
  Administered 2013-07-12: 1 mg via INTRAVENOUS
  Filled 2013-07-12: qty 1

## 2013-07-12 MED ORDER — DIAZEPAM 5 MG PO TABS: 5.0000 mg | ORAL_TABLET | Freq: Four times a day (QID) | ORAL | Status: DC | PRN

## 2013-07-12 MED ORDER — OXYCODONE-ACETAMINOPHEN 5-325 MG PO TABS: 2.0000 | ORAL_TABLET | ORAL | Status: DC | PRN

## 2013-07-12 MED ORDER — HYDROMORPHONE HCL PF 1 MG/ML IJ SOLN
1.0000 mg | Freq: Once | INTRAMUSCULAR | Status: AC
  Administered 2013-07-12: 1 mg via INTRAVENOUS
  Filled 2013-07-12: qty 1

## 2013-07-12 MED ORDER — FENTANYL CITRATE 0.05 MG/ML IJ SOLN
100.0000 ug | Freq: Once | INTRAMUSCULAR | Status: AC
  Administered 2013-07-12: 100 ug via NASAL
  Filled 2013-07-12: qty 2

## 2013-07-12 NOTE — ED Notes (Signed)
Pt c/o right lower back and hip pain that started on Wed last week.  Pt denies injuring, moving, lifting that could cause the pain.

## 2013-07-12 NOTE — ED Provider Notes (Signed)
CSN: [id number]     Arrival date & time 07/12/13  1636 History   First MD Initiated Contact with Patient 07/12/13 1824     Chief Complaint  Patient presents with  . Back Pain  . Hip Pain     (Consider location/radiation/quality/duration/timing/severity/associated sxs/prior Treatment) HPI 48 year old male with history of intermittent low back pain with prior surgery several years ago presents with about 5 days of gradual onset constant positional nonexertional low back pain radiating towards his right hip with no fever no trauma no weakness no numbness no change in bowel or bladder function no history of cancer no history of HIV no history of IV drug abuse no chest pain no shortness breath no abdominal pain; no significant improvement with Tylenol and ibuprofen at home over the last several days; pain is constant severe worse with position changes better if he stays perfectly still. Past Medical History  Diagnosis Date  . Mitral valve prolapse   . Back pain    Past Surgical History  Procedure Laterality Date  . Knee surgery    . Shoulder surgery    . Back surgery     No family history on file. History  Substance Use Topics  . Smoking status: Never Smoker   . Smokeless tobacco: Current User  . Alcohol Use: No    Review of Systems  10 Systems reviewed and are negative for acute change except as noted in the HPI.   Allergies  Penicillins and Sulfa antibiotics  Home Medications   Prior to Admission medications   Medication Sig Start Date End Date Taking? Authorizing Provider  acetaminophen (TYLENOL) 500 MG tablet Take 1,000 mg by mouth every 6 (six) hours as needed for mild pain or headache.    Yes Historical Provider, MD  beclomethasone (QVAR) 80 MCG/ACT inhaler Inhale 1 puff into the lungs 2 (two) times daily.   Yes Historical Provider, MD  fluticasone (FLONASE) 50 MCG/ACT nasal spray Place 2 sprays into both nostrils daily.   Yes Historical Provider, MD  Multiple  Vitamins-Minerals (MULTIVITAMINS THER. W/MINERALS) TABS Take 1 tablet by mouth daily.     Yes Historical Provider, MD  naproxen sodium (ANAPROX) 220 MG tablet Take 220 mg by mouth daily as needed (for pain).    Yes Historical Provider, MD  sertraline (ZOLOFT) 100 MG tablet Take 100 mg by mouth daily.   Yes Historical Provider, MD   BP 132/87  Pulse 76  Temp(Src) 98.1 F (36.7 C) (Oral)  Resp 18  Wt 220 lb (99.791 kg)  SpO2 97% Physical Exam  Nursing note and vitals reviewed. Constitutional:  Awake, alert, nontoxic appearance with baseline speech.  HENT:  Head: Atraumatic.  Eyes: Pupils are equal, round, and reactive to light. Right eye exhibits no discharge. Left eye exhibits no discharge.  Neck: Neck supple.  Cardiovascular: Normal rate and regular rhythm.   No murmur heard. Pulmonary/Chest: Effort normal and breath sounds normal. No respiratory distress. He has no wheezes. He has no rales. He exhibits no tenderness.  Abdominal: Soft. Bowel sounds are normal. He exhibits no mass. There is no tenderness. There is no rebound.  Musculoskeletal: He exhibits no edema.       Thoracic back: He exhibits no tenderness.       Lumbar back: He exhibits no tenderness.  Diffuse lumbar and paralumbar tenderness with minimal if any right sacroiliac region tenderness. Bilateral lower extremities non tender without new rashes or color change, baseline ROM with intact DP pulses, CR<2 secs all  digits bilaterally, sensation baseline light touch bilaterally for pt, DTR's symmetric and intact bilaterally KJ / AJ, motor symmetric bilateral 5 / 5 hip flexion, quadriceps, hamstrings, EHL, foot dorsiflexion, foot plantarflexion.  Neurological: He is alert.  Mental status baseline for patient.  Upper extremity motor strength and sensation intact and symmetric bilaterally.  Skin: No rash noted.  Psychiatric: He has a normal mood and affect.    ED Course  Procedures (including critical care time) Patient  improved after meds in the ED was able to sit up and stand up on his own and walk independently with antalgic but not ataxic gait. He has had good improvement in the past with Percocet and Valium short-term usage.Patient informed of clinical course, understand medical decision-making process, and agree with plan. Labs Review Labs Reviewed - No data to display  Imaging Review No results found.   EKG Interpretation None      MDM   Final diagnoses:  Low back pain    I doubt any other EMC precluding discharge at this time including, but not necessarily limited to the following:cauda equina syndrome, SBI.    Babette Relic, MD 07/14/13 2200

## 2013-07-12 NOTE — ED Notes (Signed)
,  Patient is alert and oriented x3.  He was given DC instructions and follow up visit instructions.  Patient gave verbal understanding.  He was DC ambulatory under his own power to home.  V/S stable.  He was not showing any signs of distress on DC 

## 2013-07-12 NOTE — Discharge Instructions (Signed)
SEEK IMMEDIATE MEDICAL ATTENTION IF: New numbness, tingling, weakness, or problem with the use of your arms or legs.  Severe back pain not relieved with medications.  Change in bowel or bladder control.  Increasing pain in any areas of the body (such as chest or abdominal pain).  Shortness of breath, dizziness or fainting.  Nausea (feeling sick to your stomach), vomiting, fever, or sweats.  

## 2016-08-04 DIAGNOSIS — M10071 Idiopathic gout, right ankle and foot: Secondary | ICD-10-CM | POA: Diagnosis not present

## 2016-08-04 DIAGNOSIS — M79671 Pain in right foot: Secondary | ICD-10-CM | POA: Diagnosis not present

## 2016-08-04 DIAGNOSIS — R109 Unspecified abdominal pain: Secondary | ICD-10-CM | POA: Diagnosis not present

## 2016-08-04 DIAGNOSIS — N200 Calculus of kidney: Secondary | ICD-10-CM | POA: Diagnosis not present

## 2016-08-04 DIAGNOSIS — R3129 Other microscopic hematuria: Secondary | ICD-10-CM | POA: Diagnosis not present

## 2016-08-16 DIAGNOSIS — R3129 Other microscopic hematuria: Secondary | ICD-10-CM | POA: Diagnosis not present

## 2016-08-26 DIAGNOSIS — R109 Unspecified abdominal pain: Secondary | ICD-10-CM | POA: Diagnosis not present

## 2016-08-26 DIAGNOSIS — F411 Generalized anxiety disorder: Secondary | ICD-10-CM | POA: Diagnosis not present

## 2016-08-26 DIAGNOSIS — M62830 Muscle spasm of back: Secondary | ICD-10-CM | POA: Diagnosis not present

## 2016-09-30 DIAGNOSIS — Z114 Encounter for screening for human immunodeficiency virus [HIV]: Secondary | ICD-10-CM | POA: Diagnosis not present

## 2016-09-30 DIAGNOSIS — Z125 Encounter for screening for malignant neoplasm of prostate: Secondary | ICD-10-CM | POA: Diagnosis not present

## 2016-09-30 DIAGNOSIS — R5383 Other fatigue: Secondary | ICD-10-CM | POA: Diagnosis not present

## 2016-09-30 DIAGNOSIS — E559 Vitamin D deficiency, unspecified: Secondary | ICD-10-CM | POA: Diagnosis not present

## 2016-09-30 DIAGNOSIS — Z0000 Encounter for general adult medical examination without abnormal findings: Secondary | ICD-10-CM | POA: Diagnosis not present

## 2016-10-25 DIAGNOSIS — E1165 Type 2 diabetes mellitus with hyperglycemia: Secondary | ICD-10-CM | POA: Diagnosis not present

## 2016-10-25 DIAGNOSIS — E559 Vitamin D deficiency, unspecified: Secondary | ICD-10-CM | POA: Diagnosis not present

## 2016-10-25 DIAGNOSIS — D72828 Other elevated white blood cell count: Secondary | ICD-10-CM | POA: Diagnosis not present

## 2016-10-25 DIAGNOSIS — F411 Generalized anxiety disorder: Secondary | ICD-10-CM | POA: Diagnosis not present

## 2016-10-30 DIAGNOSIS — Z800 Family history of malignant neoplasm of digestive organs: Secondary | ICD-10-CM | POA: Diagnosis not present

## 2016-10-30 DIAGNOSIS — Z1211 Encounter for screening for malignant neoplasm of colon: Secondary | ICD-10-CM | POA: Diagnosis not present

## 2016-12-05 DIAGNOSIS — K635 Polyp of colon: Secondary | ICD-10-CM | POA: Diagnosis not present

## 2016-12-05 DIAGNOSIS — Z1211 Encounter for screening for malignant neoplasm of colon: Secondary | ICD-10-CM | POA: Diagnosis not present

## 2016-12-05 DIAGNOSIS — Z01818 Encounter for other preprocedural examination: Secondary | ICD-10-CM | POA: Diagnosis not present

## 2016-12-05 DIAGNOSIS — E119 Type 2 diabetes mellitus without complications: Secondary | ICD-10-CM | POA: Diagnosis not present

## 2016-12-11 DIAGNOSIS — K635 Polyp of colon: Secondary | ICD-10-CM | POA: Diagnosis not present

## 2016-12-14 DIAGNOSIS — K635 Polyp of colon: Secondary | ICD-10-CM | POA: Diagnosis not present

## 2016-12-18 DIAGNOSIS — K635 Polyp of colon: Secondary | ICD-10-CM | POA: Diagnosis not present

## 2016-12-18 DIAGNOSIS — Z800 Family history of malignant neoplasm of digestive organs: Secondary | ICD-10-CM | POA: Diagnosis not present

## 2017-03-20 DIAGNOSIS — F411 Generalized anxiety disorder: Secondary | ICD-10-CM | POA: Diagnosis not present

## 2017-03-20 DIAGNOSIS — E7800 Pure hypercholesterolemia, unspecified: Secondary | ICD-10-CM | POA: Diagnosis not present

## 2017-03-20 DIAGNOSIS — E1165 Type 2 diabetes mellitus with hyperglycemia: Secondary | ICD-10-CM | POA: Diagnosis not present

## 2017-03-20 DIAGNOSIS — M5442 Lumbago with sciatica, left side: Secondary | ICD-10-CM | POA: Diagnosis not present

## 2017-03-20 DIAGNOSIS — E559 Vitamin D deficiency, unspecified: Secondary | ICD-10-CM | POA: Diagnosis not present

## 2017-05-08 DIAGNOSIS — E119 Type 2 diabetes mellitus without complications: Secondary | ICD-10-CM | POA: Diagnosis not present

## 2017-05-08 DIAGNOSIS — R0602 Shortness of breath: Secondary | ICD-10-CM | POA: Diagnosis not present

## 2017-05-08 DIAGNOSIS — R52 Pain, unspecified: Secondary | ICD-10-CM | POA: Diagnosis not present

## 2017-05-08 DIAGNOSIS — R05 Cough: Secondary | ICD-10-CM | POA: Diagnosis not present

## 2017-05-08 DIAGNOSIS — J111 Influenza due to unidentified influenza virus with other respiratory manifestations: Secondary | ICD-10-CM | POA: Diagnosis not present

## 2017-05-08 MED FILL — AZITHROMYCIN 500 MG TABLET: 500 | 7 days supply | Qty: 7 | Fill #0

## 2017-05-08 MED FILL — BENZONATATE 200 MG CAPS: 200 | 10 days supply | Qty: 30 | Fill #0

## 2017-05-08 MED FILL — OSELTAMIVIR PHOSPHATE 75 MG: 75 | 5 days supply | Qty: 10 | Fill #0

## 2017-05-09 DIAGNOSIS — R05 Cough: Secondary | ICD-10-CM | POA: Diagnosis not present

## 2017-05-09 DIAGNOSIS — R0602 Shortness of breath: Secondary | ICD-10-CM | POA: Diagnosis not present

## 2017-05-09 DIAGNOSIS — E119 Type 2 diabetes mellitus without complications: Secondary | ICD-10-CM | POA: Diagnosis not present

## 2017-05-09 MED FILL — ADVAIR 250/50 DISKUS: 250-50 | 30 days supply | Qty: 60 | Fill #0

## 2017-05-09 MED FILL — PROAIR RESPICLICK INHAL PWD: 108 (90 BAS | 30 days supply | Qty: 1 | Fill #0

## 2017-05-19 MED FILL — metFORMIN HCL 500 MG TABS: 500 | 30 days supply | Qty: 60 | Fill #0 | Status: TO

## 2017-05-21 DIAGNOSIS — R0602 Shortness of breath: Secondary | ICD-10-CM | POA: Diagnosis not present

## 2017-07-25 DIAGNOSIS — E119 Type 2 diabetes mellitus without complications: Secondary | ICD-10-CM | POA: Diagnosis not present

## 2017-07-25 DIAGNOSIS — M62830 Muscle spasm of back: Secondary | ICD-10-CM | POA: Diagnosis not present

## 2017-07-25 DIAGNOSIS — E7800 Pure hypercholesterolemia, unspecified: Secondary | ICD-10-CM | POA: Diagnosis not present

## 2017-07-25 DIAGNOSIS — E559 Vitamin D deficiency, unspecified: Secondary | ICD-10-CM | POA: Diagnosis not present

## 2017-07-25 DIAGNOSIS — Z79899 Other long term (current) drug therapy: Secondary | ICD-10-CM | POA: Diagnosis not present

## 2017-08-11 DIAGNOSIS — E119 Type 2 diabetes mellitus without complications: Secondary | ICD-10-CM | POA: Diagnosis not present

## 2017-08-11 DIAGNOSIS — M5442 Lumbago with sciatica, left side: Secondary | ICD-10-CM | POA: Diagnosis not present

## 2017-08-11 DIAGNOSIS — M47816 Spondylosis without myelopathy or radiculopathy, lumbar region: Secondary | ICD-10-CM | POA: Diagnosis not present

## 2017-09-07 DIAGNOSIS — M5440 Lumbago with sciatica, unspecified side: Secondary | ICD-10-CM | POA: Diagnosis not present

## 2017-09-07 DIAGNOSIS — E119 Type 2 diabetes mellitus without complications: Secondary | ICD-10-CM | POA: Diagnosis not present

## 2017-10-26 DIAGNOSIS — M5440 Lumbago with sciatica, unspecified side: Secondary | ICD-10-CM | POA: Diagnosis not present

## 2017-10-26 DIAGNOSIS — I10 Essential (primary) hypertension: Secondary | ICD-10-CM | POA: Diagnosis not present

## 2017-10-26 DIAGNOSIS — F411 Generalized anxiety disorder: Secondary | ICD-10-CM | POA: Diagnosis not present

## 2017-11-04 DIAGNOSIS — I10 Essential (primary) hypertension: Secondary | ICD-10-CM | POA: Diagnosis not present

## 2017-11-04 DIAGNOSIS — M5440 Lumbago with sciatica, unspecified side: Secondary | ICD-10-CM | POA: Diagnosis not present

## 2017-11-04 DIAGNOSIS — Z79899 Other long term (current) drug therapy: Secondary | ICD-10-CM | POA: Diagnosis not present

## 2017-11-04 DIAGNOSIS — F411 Generalized anxiety disorder: Secondary | ICD-10-CM | POA: Diagnosis not present

## 2017-11-04 DIAGNOSIS — M5137 Other intervertebral disc degeneration, lumbosacral region: Secondary | ICD-10-CM | POA: Diagnosis not present

## 2017-11-04 DIAGNOSIS — M129 Arthropathy, unspecified: Secondary | ICD-10-CM | POA: Diagnosis not present

## 2017-12-04 DIAGNOSIS — M5137 Other intervertebral disc degeneration, lumbosacral region: Secondary | ICD-10-CM | POA: Diagnosis not present

## 2017-12-04 DIAGNOSIS — R197 Diarrhea, unspecified: Secondary | ICD-10-CM | POA: Diagnosis not present

## 2017-12-04 DIAGNOSIS — M5440 Lumbago with sciatica, unspecified side: Secondary | ICD-10-CM | POA: Diagnosis not present

## 2017-12-04 DIAGNOSIS — M5030 Other cervical disc degeneration, unspecified cervical region: Secondary | ICD-10-CM | POA: Diagnosis not present

## 2017-12-04 DIAGNOSIS — Z79899 Other long term (current) drug therapy: Secondary | ICD-10-CM | POA: Diagnosis not present

## 2017-12-29 DIAGNOSIS — M545 Low back pain: Secondary | ICD-10-CM | POA: Diagnosis not present

## 2017-12-29 DIAGNOSIS — M5137 Other intervertebral disc degeneration, lumbosacral region: Secondary | ICD-10-CM | POA: Diagnosis not present

## 2018-01-02 DIAGNOSIS — M5440 Lumbago with sciatica, unspecified side: Secondary | ICD-10-CM | POA: Diagnosis not present

## 2018-01-02 DIAGNOSIS — Z79899 Other long term (current) drug therapy: Secondary | ICD-10-CM | POA: Diagnosis not present

## 2018-01-02 DIAGNOSIS — M5030 Other cervical disc degeneration, unspecified cervical region: Secondary | ICD-10-CM | POA: Diagnosis not present

## 2018-01-02 DIAGNOSIS — M5137 Other intervertebral disc degeneration, lumbosacral region: Secondary | ICD-10-CM | POA: Diagnosis not present

## 2018-01-20 DIAGNOSIS — Z86010 Personal history of colonic polyps: Secondary | ICD-10-CM | POA: Diagnosis not present

## 2018-01-20 DIAGNOSIS — Z800 Family history of malignant neoplasm of digestive organs: Secondary | ICD-10-CM | POA: Diagnosis not present

## 2018-01-20 DIAGNOSIS — Z1211 Encounter for screening for malignant neoplasm of colon: Secondary | ICD-10-CM | POA: Diagnosis not present

## 2018-01-30 DIAGNOSIS — Z79899 Other long term (current) drug therapy: Secondary | ICD-10-CM | POA: Diagnosis not present

## 2018-01-30 DIAGNOSIS — M5440 Lumbago with sciatica, unspecified side: Secondary | ICD-10-CM | POA: Diagnosis not present

## 2018-01-30 DIAGNOSIS — F411 Generalized anxiety disorder: Secondary | ICD-10-CM | POA: Diagnosis not present

## 2018-01-30 DIAGNOSIS — M5137 Other intervertebral disc degeneration, lumbosacral region: Secondary | ICD-10-CM | POA: Diagnosis not present

## 2018-02-12 DIAGNOSIS — F341 Dysthymic disorder: Secondary | ICD-10-CM | POA: Diagnosis not present

## 2018-02-12 DIAGNOSIS — F4310 Post-traumatic stress disorder, unspecified: Secondary | ICD-10-CM | POA: Diagnosis not present

## 2018-02-12 DIAGNOSIS — R5383 Other fatigue: Secondary | ICD-10-CM | POA: Diagnosis not present

## 2018-02-12 DIAGNOSIS — E7800 Pure hypercholesterolemia, unspecified: Secondary | ICD-10-CM | POA: Diagnosis not present

## 2018-02-12 DIAGNOSIS — G4700 Insomnia, unspecified: Secondary | ICD-10-CM | POA: Diagnosis not present

## 2018-02-12 DIAGNOSIS — F419 Anxiety disorder, unspecified: Secondary | ICD-10-CM | POA: Diagnosis not present

## 2018-02-12 DIAGNOSIS — Z1339 Encounter for screening examination for other mental health and behavioral disorders: Secondary | ICD-10-CM | POA: Diagnosis not present

## 2018-02-12 DIAGNOSIS — Z131 Encounter for screening for diabetes mellitus: Secondary | ICD-10-CM | POA: Diagnosis not present

## 2018-02-26 DIAGNOSIS — M5137 Other intervertebral disc degeneration, lumbosacral region: Secondary | ICD-10-CM | POA: Diagnosis not present

## 2018-02-26 DIAGNOSIS — Z79899 Other long term (current) drug therapy: Secondary | ICD-10-CM | POA: Diagnosis not present

## 2018-02-26 DIAGNOSIS — E119 Type 2 diabetes mellitus without complications: Secondary | ICD-10-CM | POA: Diagnosis not present

## 2018-02-26 DIAGNOSIS — M5440 Lumbago with sciatica, unspecified side: Secondary | ICD-10-CM | POA: Diagnosis not present

## 2018-03-18 DIAGNOSIS — Z1339 Encounter for screening examination for other mental health and behavioral disorders: Secondary | ICD-10-CM | POA: Diagnosis not present

## 2018-03-18 DIAGNOSIS — F4310 Post-traumatic stress disorder, unspecified: Secondary | ICD-10-CM | POA: Diagnosis not present

## 2018-03-18 DIAGNOSIS — G4700 Insomnia, unspecified: Secondary | ICD-10-CM | POA: Diagnosis not present

## 2018-03-18 DIAGNOSIS — F419 Anxiety disorder, unspecified: Secondary | ICD-10-CM | POA: Diagnosis not present

## 2018-03-18 DIAGNOSIS — F341 Dysthymic disorder: Secondary | ICD-10-CM | POA: Diagnosis not present

## 2018-03-27 DIAGNOSIS — Z79899 Other long term (current) drug therapy: Secondary | ICD-10-CM | POA: Diagnosis not present

## 2018-03-27 DIAGNOSIS — M5440 Lumbago with sciatica, unspecified side: Secondary | ICD-10-CM | POA: Diagnosis not present

## 2018-03-27 DIAGNOSIS — M5137 Other intervertebral disc degeneration, lumbosacral region: Secondary | ICD-10-CM | POA: Diagnosis not present

## 2018-04-24 DIAGNOSIS — M5137 Other intervertebral disc degeneration, lumbosacral region: Secondary | ICD-10-CM | POA: Diagnosis not present

## 2018-04-24 DIAGNOSIS — Z79899 Other long term (current) drug therapy: Secondary | ICD-10-CM | POA: Diagnosis not present

## 2018-04-24 DIAGNOSIS — M5440 Lumbago with sciatica, unspecified side: Secondary | ICD-10-CM | POA: Diagnosis not present

## 2018-05-22 DIAGNOSIS — M5137 Other intervertebral disc degeneration, lumbosacral region: Secondary | ICD-10-CM | POA: Diagnosis not present

## 2018-05-22 DIAGNOSIS — M5440 Lumbago with sciatica, unspecified side: Secondary | ICD-10-CM | POA: Diagnosis not present

## 2018-05-22 DIAGNOSIS — Z79899 Other long term (current) drug therapy: Secondary | ICD-10-CM | POA: Diagnosis not present

## 2018-06-17 DIAGNOSIS — G4700 Insomnia, unspecified: Secondary | ICD-10-CM | POA: Diagnosis not present

## 2018-06-17 DIAGNOSIS — F341 Dysthymic disorder: Secondary | ICD-10-CM | POA: Diagnosis not present

## 2018-06-17 DIAGNOSIS — F4310 Post-traumatic stress disorder, unspecified: Secondary | ICD-10-CM | POA: Diagnosis not present

## 2018-06-17 DIAGNOSIS — F419 Anxiety disorder, unspecified: Secondary | ICD-10-CM | POA: Diagnosis not present

## 2018-06-22 DIAGNOSIS — Z9189 Other specified personal risk factors, not elsewhere classified: Secondary | ICD-10-CM | POA: Diagnosis not present

## 2018-06-22 DIAGNOSIS — M5137 Other intervertebral disc degeneration, lumbosacral region: Secondary | ICD-10-CM | POA: Diagnosis not present

## 2018-07-17 DIAGNOSIS — Z9189 Other specified personal risk factors, not elsewhere classified: Secondary | ICD-10-CM | POA: Diagnosis not present

## 2018-07-17 DIAGNOSIS — M5137 Other intervertebral disc degeneration, lumbosacral region: Secondary | ICD-10-CM | POA: Diagnosis not present

## 2018-08-10 DIAGNOSIS — Z79899 Other long term (current) drug therapy: Secondary | ICD-10-CM | POA: Diagnosis not present

## 2018-08-10 DIAGNOSIS — M5137 Other intervertebral disc degeneration, lumbosacral region: Secondary | ICD-10-CM | POA: Diagnosis not present

## 2018-09-03 DIAGNOSIS — R0602 Shortness of breath: Secondary | ICD-10-CM | POA: Diagnosis not present

## 2018-09-03 DIAGNOSIS — R5383 Other fatigue: Secondary | ICD-10-CM | POA: Diagnosis not present

## 2018-09-03 DIAGNOSIS — E7800 Pure hypercholesterolemia, unspecified: Secondary | ICD-10-CM | POA: Diagnosis not present

## 2018-09-03 DIAGNOSIS — Z114 Encounter for screening for human immunodeficiency virus [HIV]: Secondary | ICD-10-CM | POA: Diagnosis not present

## 2018-09-03 DIAGNOSIS — Z0000 Encounter for general adult medical examination without abnormal findings: Secondary | ICD-10-CM | POA: Diagnosis not present

## 2018-09-03 DIAGNOSIS — E119 Type 2 diabetes mellitus without complications: Secondary | ICD-10-CM | POA: Diagnosis not present

## 2018-09-03 DIAGNOSIS — E559 Vitamin D deficiency, unspecified: Secondary | ICD-10-CM | POA: Diagnosis not present

## 2018-09-03 DIAGNOSIS — Z125 Encounter for screening for malignant neoplasm of prostate: Secondary | ICD-10-CM | POA: Diagnosis not present

## 2018-09-09 DIAGNOSIS — M5137 Other intervertebral disc degeneration, lumbosacral region: Secondary | ICD-10-CM | POA: Diagnosis not present

## 2018-09-09 DIAGNOSIS — Z79899 Other long term (current) drug therapy: Secondary | ICD-10-CM | POA: Diagnosis not present

## 2018-09-09 MED FILL — oxyCODONE HCL 15 MG TABS: 15 | 14 days supply | Qty: 42 | Fill #0

## 2018-09-10 MED FILL — traZODone HCL 100 MG TABS: 100 | 30 days supply | Qty: 60 | Fill #0

## 2018-09-10 MED FILL — metFORMIN HCL 500 MG TABS: 500 | 30 days supply | Qty: 60 | Fill #0

## 2018-09-10 MED FILL — PROAIR RESPICLICK INHAL PWD: 108 (90 BAS | 30 days supply | Qty: 1 | Fill #0

## 2018-09-10 MED FILL — SERTRALINE HCL 100 MG TABS: 100 | 45 days supply | Qty: 45 | Fill #0

## 2018-09-10 MED FILL — CloNIDine HCL 0.1 MG TAB: 0.1 | 90 days supply | Qty: 180 | Fill #0

## 2018-09-10 MED FILL — ADVAIR 250/50 DISKUS: 250-50 | 90 days supply | Qty: 180 | Fill #0

## 2018-09-15 MED FILL — ALPRAZolam 0.5 MG TABS: 0.5 | 3 days supply | Qty: 6 | Fill #0

## 2018-09-16 ENCOUNTER — Ambulatory Visit: Payer: 59 | Admitting: Pulmonary Disease

## 2018-09-16 ENCOUNTER — Encounter: Payer: Self-pay | Admitting: Pulmonary Disease

## 2018-09-16 ENCOUNTER — Other Ambulatory Visit: Payer: Self-pay

## 2018-09-16 VITALS — BP 140/84 | HR 79 | Temp 98.4°F | Ht 70.0 in | Wt 267.2 lb

## 2018-09-16 DIAGNOSIS — R0602 Shortness of breath: Secondary | ICD-10-CM | POA: Diagnosis not present

## 2018-09-16 NOTE — Patient Instructions (Signed)
Shortness of breath Occasional wheezing Abnormal spirometry recently  We will obtain a full PFT with methacholine challenge  Continue using inhalers Will review PFTs and make further recommendations from there  Call with significant concerns

## 2018-09-16 NOTE — Progress Notes (Signed)
Subjective:    Patient ID: Robert Wilkerson, male    DOB: Aug 13, 1965, 53 y.o.   MRN: [id number]  Patient being seen for shortness of breath  abnormal spirometry during a recent physical   Admits to occasional wheezing Intermittent shortness of breath with exertion  No chest pains or chest discomfort Not feeling acutely ill  He does wheeze occasionally More so sometimes when he is laying flat  He has a history of reflux-uses Prilosec and feels symptoms are better controlled  Is on inhalers Advair and albuterol use as needed Albuterol use about 3 times a week  Not limited with normal activities  No history of smoking No occupational predisposition to lung disease     Review of Systems  Constitutional: Negative for fever and unexpected weight change.  HENT: Negative for congestion, dental problem, ear pain, nosebleeds, postnasal drip, rhinorrhea, sinus pressure, sneezing, sore throat and trouble swallowing.   Eyes: Negative for redness and itching.  Respiratory: Positive for chest tightness, shortness of breath and wheezing. Negative for cough.   Cardiovascular: Negative for palpitations and leg swelling.  Gastrointestinal: Negative for nausea and vomiting.  Genitourinary: Negative for dysuria.  Musculoskeletal: Negative for joint swelling.  Skin: Negative for rash.  Allergic/Immunologic: Positive for environmental allergies. Negative for food allergies and immunocompromised state.  Neurological: Positive for headaches.  Hematological: Does not bruise/bleed easily.  Psychiatric/Behavioral: Negative for dysphoric mood. The patient is not nervous/anxious.    Past Medical History:  Diagnosis Date  . Back pain   . Mitral valve prolapse    Social History   Socioeconomic History  . Marital status: Legally Separated    Spouse name: Not on file  . Number of children: Not on file  . Years of education: Not on file  . Highest education level: Not on file  Occupational  History  . Not on file  Social Needs  . Financial resource strain: Not on file  . Food insecurity    Worry: Not on file    Inability: Not on file  . Transportation needs    Medical: Not on file    Non-medical: Not on file  Tobacco Use  . Smoking status: Never Smoker  . Smokeless tobacco: Current User    Types: Snuff  Substance and Sexual Activity  . Alcohol use: No  . Drug use: No  . Sexual activity: Not on file  Lifestyle  . Physical activity    Days per week: Not on file    Minutes per session: Not on file  . Stress: Not on file  Relationships  . Social Herbalist on phone: Not on file    Gets together: Not on file    Attends religious service: Not on file    Active member of club or organization: Not on file    Attends meetings of clubs or organizations: Not on file    Relationship status: Not on file  . Intimate partner violence    Fear of current or ex partner: Not on file    Emotionally abused: Not on file    Physically abused: Not on file    Forced sexual activity: Not on file  Other Topics Concern  . Not on file  Social History Narrative  . Not on file   History reviewed. No pertinent family history.      Objective:   Physical Exam Constitutional:      Appearance: Normal appearance.  HENT:     Head: Normocephalic  and atraumatic.  Neck:     Musculoskeletal: Normal range of motion and neck supple. No neck rigidity or muscular tenderness.  Cardiovascular:     Rate and Rhythm: Normal rate and regular rhythm.     Heart sounds: No murmur.  Pulmonary:     Effort: Pulmonary effort is normal. No respiratory distress.     Breath sounds: No stridor. No wheezing or rhonchi.  Abdominal:     General: There is no distension.     Tenderness: There is no abdominal tenderness.  Musculoskeletal:        General: No swelling or tenderness.  Skin:    General: Skin is warm and dry.     Coloration: Skin is not jaundiced or pale.  Neurological:      General: No focal deficit present.     Mental Status: He is alert.  Psychiatric:        Mood and Affect: Mood normal.        Behavior: Behavior normal.    Vitals:   09/16/18 0959  BP: 140/84  Pulse: 79  Temp: 98.4 F (36.9 C)  SpO2: 98%   Spirometry not available to review      Assessment & Plan:  .  Shortness of breath -Never smoker -no occupational predisposition -History of wheezing -History of allergies -History of reflux-well-controlled -Improvement with use of inhalers  -We will obtain a full pulmonary function study and methacholine challenge -We will continue using albuterol and Advair  We will make changes to inhalers Further evaluation depending on PFT findings -

## 2018-09-17 DIAGNOSIS — F419 Anxiety disorder, unspecified: Secondary | ICD-10-CM | POA: Diagnosis not present

## 2018-09-17 DIAGNOSIS — G4700 Insomnia, unspecified: Secondary | ICD-10-CM | POA: Diagnosis not present

## 2018-09-17 DIAGNOSIS — F341 Dysthymic disorder: Secondary | ICD-10-CM | POA: Diagnosis not present

## 2018-09-18 MED FILL — ALPRAZolam 0.5 MG TABS: 0.5 | 30 days supply | Qty: 60 | Fill #0

## 2018-09-22 DIAGNOSIS — Z79899 Other long term (current) drug therapy: Secondary | ICD-10-CM | POA: Diagnosis not present

## 2018-09-22 DIAGNOSIS — M5137 Other intervertebral disc degeneration, lumbosacral region: Secondary | ICD-10-CM | POA: Diagnosis not present

## 2018-09-22 MED FILL — oxyCODONE HCL 15 MG TABS: 15 | 14 days supply | Qty: 42 | Fill #0

## 2018-10-06 DIAGNOSIS — M5137 Other intervertebral disc degeneration, lumbosacral region: Secondary | ICD-10-CM | POA: Diagnosis not present

## 2018-10-06 DIAGNOSIS — Z79899 Other long term (current) drug therapy: Secondary | ICD-10-CM | POA: Diagnosis not present

## 2018-10-06 MED FILL — oxyCODONE HCL 15 MG TABS: 15 | 14 days supply | Qty: 42 | Fill #0

## 2018-10-17 MED FILL — ALPRAZolam 0.5 MG TABS: 0.5 | 30 days supply | Qty: 60 | Fill #1

## 2018-10-20 DIAGNOSIS — Z79899 Other long term (current) drug therapy: Secondary | ICD-10-CM | POA: Diagnosis not present

## 2018-10-20 DIAGNOSIS — M5137 Other intervertebral disc degeneration, lumbosacral region: Secondary | ICD-10-CM | POA: Diagnosis not present

## 2018-10-20 MED FILL — oxyCODONE HCL 15 MG TABS: 15 | 30 days supply | Qty: 90 | Fill #0

## 2018-10-22 MED FILL — CLINDAMYCIN HCL 150 MG CAPS: 150 | 7 days supply | Qty: 21 | Fill #0

## 2018-10-23 ENCOUNTER — Telehealth: Payer: Self-pay | Admitting: Pulmonary Disease

## 2018-10-23 NOTE — Telephone Encounter (Signed)
Closed for the day will call them on Monday to set this up Robert Wilkerson

## 2018-10-26 NOTE — Telephone Encounter (Signed)
Left a message in [facility] about this not sure if they are doing these right now no call back yet I spoke to pt he is aware that I will get back with him as soon as I have an answer for him Joellen Jersey

## 2018-10-28 NOTE — Telephone Encounter (Signed)
Pt called back and rescheduled the methacholine test to 11/06/18@9am  Robert Wilkerson

## 2018-10-28 NOTE — Telephone Encounter (Signed)
Spoke to [email or web address]  pulmonary methacholine challenge test set up for 11/05/18@10 :00am @[facility]  lmtcb with pt Robert Wilkerson

## 2018-11-05 ENCOUNTER — Encounter (HOSPITAL_COMMUNITY): Payer: 59

## 2018-11-06 ENCOUNTER — Inpatient Hospital Stay (HOSPITAL_COMMUNITY): Admission: RE | Admit: 2018-11-06 | Payer: 59 | Source: Ambulatory Visit

## 2018-11-07 MED FILL — SERTRALINE HCL 100 MG TAB: 100 | 30 days supply | Qty: 45 | Fill #0

## 2018-11-07 MED FILL — metFORMIN HCL 500 MG TABS: 500 | 30 days supply | Qty: 60 | Fill #1

## 2018-11-14 MED FILL — CLINDAMYCIN HCL 150 MG CAPS: 150 | 7 days supply | Qty: 21 | Fill #1

## 2018-11-14 MED FILL — ALPRAZolam 0.5 MG TABS: 0.5 | 30 days supply | Qty: 60 | Fill #2

## 2018-11-18 DIAGNOSIS — M5137 Other intervertebral disc degeneration, lumbosacral region: Secondary | ICD-10-CM | POA: Diagnosis not present

## 2018-11-18 DIAGNOSIS — Z79899 Other long term (current) drug therapy: Secondary | ICD-10-CM | POA: Diagnosis not present

## 2018-11-18 MED FILL — oxyCODONE HCL 15 MG TABS: 15 | 30 days supply | Qty: 90 | Fill #0

## 2018-12-10 MED FILL — SERTRALINE HCL 100 MG TAB: 100 | 30 days supply | Qty: 45 | Fill #1

## 2018-12-17 DIAGNOSIS — G4700 Insomnia, unspecified: Secondary | ICD-10-CM | POA: Diagnosis not present

## 2018-12-17 DIAGNOSIS — Z7189 Other specified counseling: Secondary | ICD-10-CM | POA: Diagnosis not present

## 2018-12-17 DIAGNOSIS — F419 Anxiety disorder, unspecified: Secondary | ICD-10-CM | POA: Diagnosis not present

## 2018-12-17 DIAGNOSIS — F341 Dysthymic disorder: Secondary | ICD-10-CM | POA: Diagnosis not present

## 2018-12-17 MED FILL — traZODone HCL 100 MG TABS: 100 | 30 days supply | Qty: 60 | Fill #0

## 2018-12-17 MED FILL — ALPRAZolam 0.5 MG TABS: 0.5 | 30 days supply | Qty: 15 | Fill #0

## 2018-12-17 MED FILL — CloNIDine HCL 0.1 MG TAB: 0.1 | 30 days supply | Qty: 60 | Fill #0

## 2018-12-17 MED FILL — metFORMIN HCL 500 MG TABS: 500 | 30 days supply | Qty: 60 | Fill #0

## 2018-12-18 DIAGNOSIS — Z23 Encounter for immunization: Secondary | ICD-10-CM | POA: Diagnosis not present

## 2018-12-18 DIAGNOSIS — M5137 Other intervertebral disc degeneration, lumbosacral region: Secondary | ICD-10-CM | POA: Diagnosis not present

## 2018-12-18 DIAGNOSIS — Z79899 Other long term (current) drug therapy: Secondary | ICD-10-CM | POA: Diagnosis not present

## 2018-12-18 DIAGNOSIS — Z7189 Other specified counseling: Secondary | ICD-10-CM | POA: Diagnosis not present

## 2018-12-18 MED FILL — oxyCODONE HCL 15 MG TABS: 15 | 30 days supply | Qty: 90 | Fill #0

## 2019-01-14 MED FILL — ALPRAZolam 0.5 MG TABS: 0.5 | 30 days supply | Qty: 15 | Fill #1

## 2019-01-15 DIAGNOSIS — M5137 Other intervertebral disc degeneration, lumbosacral region: Secondary | ICD-10-CM | POA: Diagnosis not present

## 2019-01-15 DIAGNOSIS — Z79899 Other long term (current) drug therapy: Secondary | ICD-10-CM | POA: Diagnosis not present

## 2019-01-15 MED FILL — oxyCODONE HCL 15 MG TABS: 15 | 30 days supply | Qty: 90 | Fill #0

## 2019-01-22 MED FILL — metFORMIN HCL 500 MG TABS: 500 | 30 days supply | Qty: 60 | Fill #1

## 2019-02-08 MED FILL — SERTRALINE HCL 100 MG TAB: 100 | 30 days supply | Qty: 45 | Fill #0

## 2019-02-08 MED FILL — CloNIDine HCL 0.1 MG TAB: 0.1 | 30 days supply | Qty: 60 | Fill #1

## 2019-02-11 MED FILL — ALPRAZolam 0.5 MG TABS: 0.5 | 30 days supply | Qty: 15 | Fill #2

## 2019-02-12 DIAGNOSIS — M5137 Other intervertebral disc degeneration, lumbosacral region: Secondary | ICD-10-CM | POA: Diagnosis not present

## 2019-02-12 DIAGNOSIS — Z79899 Other long term (current) drug therapy: Secondary | ICD-10-CM | POA: Diagnosis not present

## 2019-02-12 MED FILL — oxyCODONE HCL 15 MG TABS: 15 | 30 days supply | Qty: 90 | Fill #0

## 2019-02-23 MED FILL — traZODone HCL 100 MG TABS: 100 | 30 days supply | Qty: 60 | Fill #1

## 2019-02-23 MED FILL — metFORMIN HCL 500 MG TABS: 500 | 30 days supply | Qty: 60 | Fill #2

## 2019-02-24 MED FILL — CHLORHEXIDINE 0.12% RINSE: 0.12 | 15 days supply | Qty: 473 | Fill #0

## 2019-02-24 MED FILL — HYDROCODON-APAP 5-325: 5-325 | 1 days supply | Qty: 5 | Fill #0

## 2019-02-24 MED FILL — CLINDAMYCIN HCL 300 MG CAPS: 300 | 6 days supply | Qty: 29 | Fill #0

## 2019-03-10 MED FILL — SERTRALINE HCL 100 MG TAB: 100 | 30 days supply | Qty: 45 | Fill #1

## 2019-03-12 DIAGNOSIS — R05 Cough: Secondary | ICD-10-CM | POA: Diagnosis not present

## 2019-03-12 DIAGNOSIS — J018 Other acute sinusitis: Secondary | ICD-10-CM | POA: Diagnosis not present

## 2019-03-12 DIAGNOSIS — Z79899 Other long term (current) drug therapy: Secondary | ICD-10-CM | POA: Diagnosis not present

## 2019-03-12 DIAGNOSIS — J019 Acute sinusitis, unspecified: Secondary | ICD-10-CM | POA: Diagnosis not present

## 2019-03-12 DIAGNOSIS — M5137 Other intervertebral disc degeneration, lumbosacral region: Secondary | ICD-10-CM | POA: Diagnosis not present

## 2019-03-12 MED FILL — oxyCODONE HCL 15 MG TABS: 15 | 30 days supply | Qty: 90 | Fill #0

## 2019-03-12 MED FILL — AZITHROMYCIN 250 MG TABLET: 250 | 5 days supply | Qty: 6 | Fill #0

## 2019-03-12 MED FILL — predniSONE 5 MG (21) TBPK: 5 | 6 days supply | Qty: 21 | Fill #0

## 2019-03-25 DIAGNOSIS — F341 Dysthymic disorder: Secondary | ICD-10-CM | POA: Diagnosis not present

## 2019-03-25 DIAGNOSIS — G4700 Insomnia, unspecified: Secondary | ICD-10-CM | POA: Diagnosis not present

## 2019-03-25 DIAGNOSIS — F419 Anxiety disorder, unspecified: Secondary | ICD-10-CM | POA: Diagnosis not present

## 2019-03-25 MED FILL — CloNIDine HCL 0.1 MG TAB: 0.1 | 30 days supply | Qty: 60 | Fill #0

## 2019-03-25 MED FILL — traZODone HCL 100 MG TABS: 100 | 30 days supply | Qty: 60 | Fill #0

## 2019-03-25 MED FILL — ALPRAZolam 0.5 MG TABS: 0.5 | 30 days supply | Qty: 30 | Fill #0

## 2019-04-01 MED FILL — metFORMIN HCL 500 MG TABS: 500 | 3 days supply | Qty: 6 | Fill #0

## 2019-04-03 MED FILL — metFORMIN HCL 500 MG TABS: 500 | 30 days supply | Qty: 60 | Fill #0

## 2019-04-05 DIAGNOSIS — Z1159 Encounter for screening for other viral diseases: Secondary | ICD-10-CM | POA: Diagnosis not present

## 2019-04-05 DIAGNOSIS — M25561 Pain in right knee: Secondary | ICD-10-CM | POA: Diagnosis not present

## 2019-04-05 DIAGNOSIS — E7800 Pure hypercholesterolemia, unspecified: Secondary | ICD-10-CM | POA: Diagnosis not present

## 2019-04-05 DIAGNOSIS — R0602 Shortness of breath: Secondary | ICD-10-CM | POA: Diagnosis not present

## 2019-04-05 DIAGNOSIS — E119 Type 2 diabetes mellitus without complications: Secondary | ICD-10-CM | POA: Diagnosis not present

## 2019-04-05 DIAGNOSIS — Z79899 Other long term (current) drug therapy: Secondary | ICD-10-CM | POA: Diagnosis not present

## 2019-04-05 MED FILL — ALBUTEROL SULFATE HFA 108 (: 108 (90 BAS | 30 days supply | Qty: 18 | Fill #0

## 2019-04-05 MED FILL — ADVAIR 250/50 DISKUS: 250-50 | 30 days supply | Qty: 60 | Fill #0

## 2019-04-06 MED FILL — metFORMIN HCL 1000 MG TABS: 1000 | 90 days supply | Qty: 180 | Fill #0

## 2019-04-06 MED FILL — PRAVASTATIN SODIUM 10 MG TA: 10 | 90 days supply | Qty: 90 | Fill #0

## 2019-04-08 DIAGNOSIS — Z79899 Other long term (current) drug therapy: Secondary | ICD-10-CM | POA: Diagnosis not present

## 2019-04-08 DIAGNOSIS — M5137 Other intervertebral disc degeneration, lumbosacral region: Secondary | ICD-10-CM | POA: Diagnosis not present

## 2019-04-08 MED FILL — oxyCODONE HCL 15 MG TABS: 15 | 30 days supply | Qty: 90 | Fill #0

## 2019-04-08 MED FILL — SERTRALINE HCL 100 MG TAB: 100 | 30 days supply | Qty: 45 | Fill #2

## 2019-04-22 MED FILL — ALPRAZolam 0.5 MG TABS: 0.5 | 30 days supply | Qty: 30 | Fill #1

## 2019-04-22 MED FILL — ADVAIR 250/50 DISKUS: 250-50 | 90 days supply | Qty: 180 | Fill #1

## 2019-05-05 DIAGNOSIS — Z79899 Other long term (current) drug therapy: Secondary | ICD-10-CM | POA: Diagnosis not present

## 2019-05-05 DIAGNOSIS — M5137 Other intervertebral disc degeneration, lumbosacral region: Secondary | ICD-10-CM | POA: Diagnosis not present

## 2019-05-05 MED FILL — SERTRALINE HCL 100 MG TAB: 100 | 30 days supply | Qty: 45 | Fill #0

## 2019-05-05 MED FILL — CloNIDine HCL 0.1 MG TAB: 0.1 | 30 days supply | Qty: 60 | Fill #1

## 2019-05-05 MED FILL — oxyCODONE HCL 15 MG TABS: 15 | 30 days supply | Qty: 90 | Fill #0

## 2019-05-27 DIAGNOSIS — F419 Anxiety disorder, unspecified: Secondary | ICD-10-CM | POA: Diagnosis not present

## 2019-05-27 DIAGNOSIS — E559 Vitamin D deficiency, unspecified: Secondary | ICD-10-CM | POA: Diagnosis not present

## 2019-05-27 DIAGNOSIS — F341 Dysthymic disorder: Secondary | ICD-10-CM | POA: Diagnosis not present

## 2019-05-27 DIAGNOSIS — G4700 Insomnia, unspecified: Secondary | ICD-10-CM | POA: Diagnosis not present

## 2019-05-27 MED FILL — ARIPIPRAZOLE 5 MG TABS: 5 | 30 days supply | Qty: 30 | Fill #0

## 2019-05-27 MED FILL — ALPRAZolam 0.5 MG TABS: 0.5 | 30 days supply | Qty: 30 | Fill #0

## 2019-05-27 MED FILL — traZODone HCL 100 MG TABS: 100 | 30 days supply | Qty: 60 | Fill #0

## 2019-05-27 MED FILL — VIT D2 1.25 MG (50,000 UNIT: 1.25 MG | 28 days supply | Qty: 4 | Fill #0

## 2019-06-03 DIAGNOSIS — E559 Vitamin D deficiency, unspecified: Secondary | ICD-10-CM | POA: Diagnosis not present

## 2019-06-03 DIAGNOSIS — F411 Generalized anxiety disorder: Secondary | ICD-10-CM | POA: Diagnosis not present

## 2019-06-03 DIAGNOSIS — Z79899 Other long term (current) drug therapy: Secondary | ICD-10-CM | POA: Diagnosis not present

## 2019-06-03 DIAGNOSIS — M5137 Other intervertebral disc degeneration, lumbosacral region: Secondary | ICD-10-CM | POA: Diagnosis not present

## 2019-06-03 MED FILL — CloNIDine HCL 0.1 MG TAB: 0.1 | 30 days supply | Qty: 60 | Fill #0

## 2019-06-03 MED FILL — SERTRALINE HCL 100 MG TAB: 100 | 30 days supply | Qty: 45 | Fill #1

## 2019-06-08 MED FILL — oxyCODONE HCL 10 MG TABS: 10 | 30 days supply | Qty: 120 | Fill #0

## 2019-06-09 DIAGNOSIS — E119 Type 2 diabetes mellitus without complications: Secondary | ICD-10-CM | POA: Diagnosis not present

## 2019-06-09 DIAGNOSIS — E7800 Pure hypercholesterolemia, unspecified: Secondary | ICD-10-CM | POA: Diagnosis not present

## 2019-06-09 DIAGNOSIS — M7021 Olecranon bursitis, right elbow: Secondary | ICD-10-CM | POA: Diagnosis not present

## 2019-06-09 MED FILL — INDOMETHACIN 50 MG CAPSULE: 50 | 15 days supply | Qty: 45 | Fill #0

## 2019-06-25 MED FILL — ALPRAZolam 0.5 MG TABS: 0.5 | 10 days supply | Qty: 5 | Fill #0

## 2019-06-30 DIAGNOSIS — G4700 Insomnia, unspecified: Secondary | ICD-10-CM | POA: Diagnosis not present

## 2019-06-30 DIAGNOSIS — F419 Anxiety disorder, unspecified: Secondary | ICD-10-CM | POA: Diagnosis not present

## 2019-06-30 DIAGNOSIS — F341 Dysthymic disorder: Secondary | ICD-10-CM | POA: Diagnosis not present

## 2019-06-30 MED FILL — CloNIDine HCL 0.1 MG TAB: 0.1 | 30 days supply | Qty: 60 | Fill #0

## 2019-06-30 MED FILL — ARIPIPRAZOLE 5 MG TABS: 5 | 30 days supply | Qty: 30 | Fill #0

## 2019-06-30 MED FILL — SERTRALINE HCL 100 MG TAB: 100 | 30 days supply | Qty: 45 | Fill #0

## 2019-06-30 MED FILL — traZODone HCL 100 MG TABS: 100 | 30 days supply | Qty: 60 | Fill #0

## 2019-07-01 DIAGNOSIS — M25561 Pain in right knee: Secondary | ICD-10-CM | POA: Diagnosis not present

## 2019-07-01 DIAGNOSIS — M5137 Other intervertebral disc degeneration, lumbosacral region: Secondary | ICD-10-CM | POA: Diagnosis not present

## 2019-07-01 DIAGNOSIS — Z79899 Other long term (current) drug therapy: Secondary | ICD-10-CM | POA: Diagnosis not present

## 2019-07-01 MED FILL — VIT D2 1.25 MG (50,000 UNIT: 1.25 MG | 28 days supply | Qty: 4 | Fill #1

## 2019-07-01 MED FILL — oxyCODONE HCL 15 MG TABS: 15 | 30 days supply | Qty: 120 | Fill #0

## 2019-07-03 MED FILL — ALPRAZolam 0.5 MG TABS: 0.5 | 60 days supply | Qty: 30 | Fill #0

## 2019-07-20 ENCOUNTER — Emergency Department (HOSPITAL_BASED_OUTPATIENT_CLINIC_OR_DEPARTMENT_OTHER): Payer: 59

## 2019-07-20 ENCOUNTER — Encounter (HOSPITAL_BASED_OUTPATIENT_CLINIC_OR_DEPARTMENT_OTHER): Payer: Self-pay | Admitting: *Deleted

## 2019-07-20 ENCOUNTER — Emergency Department (HOSPITAL_BASED_OUTPATIENT_CLINIC_OR_DEPARTMENT_OTHER)
Admission: EM | Admit: 2019-07-20 | Discharge: 2019-07-20 | Disposition: A | Discharge status: Home / Self Care | Payer: 59 | Attending: Emergency Medicine | Admitting: Emergency Medicine

## 2019-07-20 ENCOUNTER — Other Ambulatory Visit: Payer: Self-pay

## 2019-07-20 DIAGNOSIS — Z880 Allergy status to penicillin: Secondary | ICD-10-CM | POA: Diagnosis not present

## 2019-07-20 DIAGNOSIS — R1011 Right upper quadrant pain: Secondary | ICD-10-CM | POA: Insufficient documentation

## 2019-07-20 DIAGNOSIS — Z882 Allergy status to sulfonamides status: Secondary | ICD-10-CM | POA: Insufficient documentation

## 2019-07-20 DIAGNOSIS — Z79899 Other long term (current) drug therapy: Secondary | ICD-10-CM | POA: Diagnosis not present

## 2019-07-20 DIAGNOSIS — R0602 Shortness of breath: Secondary | ICD-10-CM | POA: Diagnosis not present

## 2019-07-20 DIAGNOSIS — R1110 Vomiting, unspecified: Secondary | ICD-10-CM | POA: Diagnosis not present

## 2019-07-20 DIAGNOSIS — E119 Type 2 diabetes mellitus without complications: Secondary | ICD-10-CM | POA: Diagnosis not present

## 2019-07-20 DIAGNOSIS — R197 Diarrhea, unspecified: Secondary | ICD-10-CM | POA: Insufficient documentation

## 2019-07-20 DIAGNOSIS — Z1159 Encounter for screening for other viral diseases: Secondary | ICD-10-CM | POA: Diagnosis not present

## 2019-07-20 DIAGNOSIS — E7800 Pure hypercholesterolemia, unspecified: Secondary | ICD-10-CM | POA: Diagnosis not present

## 2019-07-20 DIAGNOSIS — R112 Nausea with vomiting, unspecified: Secondary | ICD-10-CM | POA: Insufficient documentation

## 2019-07-20 DIAGNOSIS — R1084 Generalized abdominal pain: Secondary | ICD-10-CM | POA: Diagnosis not present

## 2019-07-20 LAB — COMPREHENSIVE METABOLIC PANEL
ALT: 42 U/L (ref 0–44)
AST: 33 U/L (ref 15–41)
Albumin: 4.9 g/dL (ref 3.5–5.0)
Alkaline Phosphatase: 67 U/L (ref 38–126)
Anion gap: 13 (ref 5–15)
BUN: 16 mg/dL (ref 6–20)
CO2: 24 mmol/L (ref 22–32)
Calcium: 9.3 mg/dL (ref 8.9–10.3)
Chloride: 97 mmol/L — ABNORMAL LOW (ref 98–111)
Creatinine, Ser: 1.23 mg/dL (ref 0.61–1.24)
GFR calc Af Amer: >60 mL/min (ref >60)
GFR calc non Af Amer: >60 mL/min (ref >60)
Glucose, Bld: 159 mg/dL — ABNORMAL HIGH (ref 70–99)
Potassium: 3.4 mmol/L — ABNORMAL LOW (ref 3.5–5.1)
Sodium: 134 mmol/L — ABNORMAL LOW (ref 135–145)
Total Bilirubin: 1.3 mg/dL — ABNORMAL HIGH (ref 0.3–1.2)
Total Protein: 8.5 g/dL — ABNORMAL HIGH (ref 6.5–8.1)

## 2019-07-20 LAB — CBC
HCT: 47.8 % (ref 39.0–52.0)
Hemoglobin: 16.9 g/dL (ref 13.0–17.0)
MCH: 29.8 pg (ref 26.0–34.0)
MCHC: 35.4 g/dL (ref 30.0–36.0)
MCV: 84.2 fL (ref 80.0–100.0)
Platelets: 296 10*3/uL (ref 150–400)
RBC: 5.68 MIL/uL (ref 4.22–5.81)
RDW: 12.6 % (ref 11.5–15.5)
WBC: 19.9 10*3/uL — ABNORMAL HIGH (ref 4.0–10.5)
nRBC: 0 % (ref 0.0–0.2)

## 2019-07-20 LAB — URINALYSIS, ROUTINE W REFLEX MICROSCOPIC
Glucose, UA: NEGATIVE mg/dL
Ketones, ur: 15 mg/dL — AB
Leukocytes,Ua: NEGATIVE
Nitrite: NEGATIVE
Protein, ur: 30 mg/dL — AB
Specific Gravity, Urine: 1.025 (ref 1.005–1.030)
pH: 6 (ref 5.0–8.0)

## 2019-07-20 LAB — URINALYSIS, MICROSCOPIC (REFLEX)

## 2019-07-20 LAB — LIPASE, BLOOD: Lipase: 37 U/L (ref 11–51)

## 2019-07-20 MED ORDER — FAMOTIDINE 20 MG PO TABS: 20.0000 mg | ORAL_TABLET | Freq: Two times a day (BID) | ORAL | 0 refills | Status: DC

## 2019-07-20 MED ORDER — ONDANSETRON HCL 4 MG/2ML IJ SOLN
4.0000 mg | Freq: Once | INTRAMUSCULAR | Status: AC
  Administered 2019-07-20: 4 mg via INTRAVENOUS
  Filled 2019-07-20: qty 2

## 2019-07-20 MED ORDER — SODIUM CHLORIDE 0.9 % IV BOLUS
1000.0000 mL | Freq: Once | INTRAVENOUS | Status: AC
  Administered 2019-07-20: 1000 mL via INTRAVENOUS

## 2019-07-20 MED ORDER — MORPHINE SULFATE (PF) 4 MG/ML IV SOLN
4.0000 mg | Freq: Once | INTRAVENOUS | Status: AC
  Administered 2019-07-20: 4 mg via INTRAVENOUS
  Filled 2019-07-20: qty 1

## 2019-07-20 MED ORDER — DICYCLOMINE HCL 20 MG PO TABS: 20.0000 mg | ORAL_TABLET | Freq: Two times a day (BID) | ORAL | 0 refills | Status: DC

## 2019-07-20 MED ORDER — FENTANYL CITRATE (PF) 100 MCG/2ML IJ SOLN
50.0000 ug | Freq: Once | INTRAMUSCULAR | Status: AC
  Administered 2019-07-20: 50 ug via INTRAVENOUS
  Filled 2019-07-20: qty 2

## 2019-07-20 MED ORDER — SODIUM CHLORIDE 0.9% FLUSH
3.0000 mL | Freq: Once | INTRAVENOUS | Status: DC
  Filled 2019-07-20: qty 3

## 2019-07-20 MED ORDER — IOHEXOL 300 MG/ML  SOLN
100.0000 mL | Freq: Once | INTRAMUSCULAR | Status: AC | PRN
  Administered 2019-07-20: 100 mL via INTRAVENOUS

## 2019-07-20 MED FILL — METFORMIN HCL 1000 MG TABS: 1000 | 90 days supply | Qty: 180 | Fill #0

## 2019-07-20 MED FILL — PRAVASTATIN SODIUM 10 MG TA: 10 | 90 days supply | Qty: 90 | Fill #0

## 2019-07-20 MED FILL — PROMETHAZINE 25 MG TABLET: 25 | 7 days supply | Qty: 42 | Fill #0

## 2019-07-20 NOTE — Discharge Instructions (Signed)
Take medications to help with your symptoms. Follow-up with your primary care provider. Make sure you are drinking plenty of fluids to prevent dehydration. Return to the ER if you start to experience worsening abdominal pain, develop a fever, chest pain, lightheadedness.

## 2019-07-20 NOTE — ED Notes (Signed)
Pt. Reports vomiting for the last 4 days and diarrhea for last 4 days with abd. Pain. Pt. Reports not able to hold food down since Thurs.  Trying to drink gator aid.  Pt. Reports pain in the R lower quadrant.  Pt. Has white coated tongue.  Pt. Has wife at bedside.

## 2019-07-20 NOTE — ED Triage Notes (Signed)
Abdominal pain x 5 days. Pain is RUQ. Vomiting and diarrhea.

## 2019-07-20 NOTE — ED Provider Notes (Signed)
[facility] EMER[facility]ENC[facility] DEPARTMENT Provider Note   CSN: [id number] Arrival date & time: 07/20/19  1802     History Chief Complaint  Patient presents with  . Abdominal Pain    Robert Wilkerson is a 54 y.o. male who presents to ED with a chief complaint of right upper quadrant abdominal pain, vomiting and diarrhea.  For the past 5 days has been having persistent emesis and diarrhea.  He states that he ate an egg biscuit prior to symptom onset.  He has not tried any other medications to help with his pain other than Tylenol.  He reports vomiting anytime he tries to eat or drink anything.  Had a colonoscopy about 3 years ago and was told that he had polyps but did not follow-up.  Denies history of PUD.  He does note that he drank alcohol daily up until 6 weeks ago.  He denies any chest pain, shortness of breath, urinary symptoms, prior GI bleed, anticoagulant use or prior abdominal surgeries.  HPI     Past Medical History:  Diagnosis Date  . Back pain   . Mitral valve prolapse     There are no problems to display for this patient.   Past Surgical History:  Procedure Laterality Date  . BACK SURGERY    . KNEE SURGERY    . SHOULDER SURGERY         No family history on file.  Social History   Tobacco Use  . Smoking status: Never Smoker  . Smokeless tobacco: Current User    Types: Snuff  Substance Use Topics  . Alcohol use: No  . Drug use: No    Home Medications Prior to Admission medications   Medication Sig Start Date End Date Taking? Authorizing Provider  albuterol (VENTOLIN HFA) 108 (90 Base) MCG/ACT inhaler Inhale 1-2 puffs into the lungs every 6 (six) hours as needed for wheezing or shortness of breath.   Yes [provider]  ARIPiprazole (ABILIFY) 5 MG tablet Take 5 mg by mouth daily.   Yes [provider]  fluticasone (FLONASE) 50 MCG/ACT nasal spray Place 2 sprays into both nostrils daily.   Yes [provider]  metFORMIN  (GLUCOPHAGE) 500 MG tablet Take 500 mg by mouth 2 (two) times daily with a meal.   Yes [provider]  Multiple Vitamins-Minerals (MULTIVITAMINS THER. W/MINERALS) TABS Take 1 tablet by mouth daily.     Yes [provider]  sertraline (ZOLOFT) 100 MG tablet Take 100 mg by mouth daily.   Yes [provider]  dicyclomine (BENTYL) 20 MG tablet Take 1 tablet (20 mg total) by mouth 2 (two) times daily. 07/20/19   Ardath Lepak, PA-C  famotidine (PEPCID) 20 MG tablet Take 1 tablet (20 mg total) by mouth 2 (two) times daily. 07/20/19   Silvanna Ohmer, PA-C  naproxen sodium (ANAPROX) 220 MG tablet Take 220 mg by mouth daily as needed (for pain).     [provider]    Allergies    Penicillins and Sulfa antibiotics  Review of Systems   Review of Systems  Constitutional: Negative for appetite change, chills and fever.  HENT: Negative for ear pain, rhinorrhea, sneezing and sore throat.   Eyes: Negative for photophobia and visual disturbance.  Respiratory: Negative for cough, chest tightness, shortness of breath and wheezing.   Cardiovascular: Negative for chest pain and palpitations.  Gastrointestinal: Positive for abdominal pain, diarrhea, nausea and vomiting. Negative for blood in stool and constipation.  Genitourinary:  Negative for dysuria, hematuria and urgency.  Musculoskeletal: Negative for myalgias.  Skin: Negative for rash.  Neurological: Negative for dizziness, weakness and light-headedness.    Physical Exam Updated Vital Signs BP (!) 138/91 (BP Location: Right Arm)   Pulse 88   Temp 98.2 F (36.8 C) (Oral)   Resp 20   Ht 5\' 10"  (1.778 m)   Wt 107.5 kg   SpO2 99%   BMI 34.01 kg/m   Physical Exam Vitals and nursing note reviewed.  Constitutional:      General: He is not in acute distress.    Appearance: He is well-developed.  HENT:     Head: Normocephalic and atraumatic.     Nose: Nose normal.  Eyes:     General: No scleral icterus.        Left eye: No discharge.     Conjunctiva/sclera: Conjunctivae normal.  Cardiovascular:     Rate and Rhythm: Normal rate and regular rhythm.     Heart sounds: Normal heart sounds. No murmur. No friction rub. No gallop.   Pulmonary:     Effort: Pulmonary effort is normal. No respiratory distress.     Breath sounds: Normal breath sounds.  Abdominal:     General: Bowel sounds are normal. There is no distension.     Palpations: Abdomen is soft.     Tenderness: There is abdominal tenderness in the right upper quadrant. There is no guarding.  Musculoskeletal:        General: Normal range of motion.     Cervical back: Normal range of motion and neck supple.  Skin:    General: Skin is warm and dry.     Findings: No rash.  Neurological:     Mental Status: He is alert.     Motor: No abnormal muscle tone.     Coordination: Coordination normal.     ED Results / Procedures / Treatments   Labs (all labs ordered are listed, but only abnormal results are displayed) Labs Reviewed  COMPREHENSIVE METABOLIC PANEL - Abnormal; Notable for the following components:      Result Value   Sodium 134 (*)    Potassium 3.4 (*)    Chloride 97 (*)    Glucose, Bld 159 (*)    Total Protein 8.5 (*)    Total Bilirubin 1.3 (*)    All other components within normal limits  CBC - Abnormal; Notable for the following components:   WBC 19.9 (*)    All other components within normal limits  URINALYSIS, ROUTINE W REFLEX MICROSCOPIC - Abnormal; Notable for the following components:   Hgb urine dipstick TRACE (*)    Bilirubin Urine SMALL (*)    Ketones, ur 15 (*)    Protein, ur 30 (*)    All other components within normal limits  URINALYSIS, MICROSCOPIC (REFLEX) - Abnormal; Notable for the following components:   Bacteria, UA RARE (*)    All other components within normal limits  LIPASE, BLOOD    EKG None  Radiology CT ABDOMEN PELVIS W CONTRAST  Result Date: 07/20/2019 CLINICAL DATA:  Abdominal pain,  right upper quadrant pain, vomiting EXAM: CT ABDOMEN AND PELVIS WITH CONTRAST TECHNIQUE: Multidetector CT imaging of the abdomen and pelvis was performed using the standard protocol following bolus administration of intravenous contrast. CONTRAST:  160mL OMNIPAQUE IOHEXOL 300 MG/ML  SOLN COMPARISON:  None. FINDINGS: Lower chest: 4 mm nodule in the left lower lobe on image 7. Lung bases otherwise clear. No effusions. Hepatobiliary: Diffuse  low-density throughout the liver compatible with fatty infiltration. No focal abnormality. Gallbladder unremarkable. Pancreas: No focal abnormality or ductal dilatation. Spleen: No focal abnormality.  Normal size. Adrenals/Urinary Tract: No adrenal abnormality. No focal renal abnormality. No stones or hydronephrosis. Urinary bladder is unremarkable. Stomach/Bowel: Stomach, large and small bowel grossly unremarkable. Vascular/Lymphatic: No evidence of aneurysm or adenopathy. Reproductive: No visible focal abnormality. Other: No free fluid or free air. Musculoskeletal: No acute bony abnormality. Postoperative changes in the lower lumbar spine. IMPRESSION: Diffuse fatty infiltration of the liver. No acute findings in the abdomen or pelvis. Electronically Signed   By: Rolm Baptise M.D.   On: 07/20/2019 20:08    Procedures Procedures (including critical care time)  Medications Ordered in ED Medications  sodium chloride flush (NS) 0.9 % injection 3 mL (3 mLs Intravenous Not Given 07/20/19 2001)  sodium chloride 0.9 % bolus 1,000 mL ( Intravenous Stopped 07/20/19 2143)  ondansetron (ZOFRAN) injection 4 mg (4 mg Intravenous Given 07/20/19 1939)  morphine 4 MG/ML injection 4 mg (4 mg Intravenous Given 07/20/19 1946)  iohexol (OMNIPAQUE) 300 MG/ML solution 100 mL (100 mLs Intravenous Contrast Given 07/20/19 1956)  fentaNYL (SUBLIMAZE) injection 50 mcg (50 mcg Intravenous Given 07/20/19 2110)    ED Course  I have reviewed the triage vital signs and the nursing notes.  Pertinent  labs & imaging results that were available during my care of the patient were reviewed by me and considered in my medical decision making (see chart for details).    MDM Rules/Calculators/A&P                      54 year old male presenting to the ED with chief complaint of right upper quadrant pain, vomiting and diarrhea for the past 5 days.  Minimal improvement noted with Tylenol.  On exam there is tenderness location of the right upper quadrant without rebound or guarding.  He is afebrile without recent use of antipyretics.  Initial tachycardia could be secondary to dehydration and discomfort.  Lab work here significant for leukocytosis of 19.9, CMP showing slight elevation in total bili to 1.3.  LFTs otherwise within normal limits.  Lipase unremarkable.  Ketonuria noted on exam.  CT of abdomen pelvis shows findings consistent with fatty liver without any acute findings.  No gallstones or other gallbladder abnormalities noted.  Patient reports some improvement in his symptoms with medications given here.  Suspect that his symptoms could be viral in nature.  No evidence of cholecystitis, pancreatitis, appendicitis or other emergent cause based on today's lab work and physical exam findings.  Tachycardia has improved with pain control and IV fluids.  We will have him continue antiemetics, antacids and Bentyl to help with symptoms.  Patient agreeable to follow-up with PCP and return for worsening symptoms.  All imaging, if done today, including plain films, CT scans, and ultrasounds, independently reviewed by me, and interpretations confirmed via formal radiology reads.  Patient is hemodynamically stable, in NAD, and able to ambulate in the ED. Evaluation does not show pathology that would require ongoing emergent intervention or inpatient treatment. I explained the diagnosis to the patient. Pain has been managed and has no complaints prior to discharge. Patient is comfortable with above plan and is stable  for discharge at this time. All questions were answered prior to disposition. Strict return precautions for returning to the ED were discussed. Encouraged follow up with PCP.   An After Visit Summary was printed and given to the patient.  Portions of this note were generated with [employer]. Dictation errors may occur despite best attempts at proofreading.  Final Clinical Impression(s) / ED Diagnoses Final diagnoses:  Nausea vomiting and diarrhea    Rx / DC Orders ED Discharge Orders         Ordered    dicyclomine (BENTYL) 20 MG tablet  2 times daily     07/20/19 2108    famotidine (PEPCID) 20 MG tablet  2 times daily     07/20/19 2108           Delia Heady, PA-C 07/20/19 2154    Lucrezia Starch, MD 07/21/19 1258

## 2019-07-20 NOTE — ED Notes (Signed)
Pt requesting pain medication. Khatri PA made aware. Awaiting further orders.

## 2019-07-21 MED FILL — DICYCLOMINE 20 MG TABLET: 20 | 10 days supply | Qty: 20 | Fill #0

## 2019-07-21 MED FILL — FAMOTIDINE 20 MG TABLET: 20 | 15 days supply | Qty: 30 | Fill #0

## 2019-07-30 MED FILL — oxyCODONE HCL 15 MG TABS: 15 | 30 days supply | Qty: 120 | Fill #0

## 2019-07-30 MED FILL — ARIPIPRAZOLE 5 MG TABS: 5 | 30 days supply | Qty: 30 | Fill #1

## 2019-08-27 DIAGNOSIS — Z79899 Other long term (current) drug therapy: Secondary | ICD-10-CM | POA: Diagnosis not present

## 2019-08-27 DIAGNOSIS — M5137 Other intervertebral disc degeneration, lumbosacral region: Secondary | ICD-10-CM | POA: Diagnosis not present

## 2019-08-27 DIAGNOSIS — F411 Generalized anxiety disorder: Secondary | ICD-10-CM | POA: Diagnosis not present

## 2019-08-27 DIAGNOSIS — K219 Gastro-esophageal reflux disease without esophagitis: Secondary | ICD-10-CM | POA: Diagnosis not present

## 2019-08-27 MED FILL — oxyCODONE HCL 15 MG TABS: 15 | 30 days supply | Qty: 120 | Fill #0

## 2019-08-31 MED FILL — ALPRAZolam 0.5 MG TABS: 0.5 | 60 days supply | Qty: 30 | Fill #1

## 2019-09-09 MED FILL — SERTRALINE HCL 100 MG TABS: 100 | 30 days supply | Qty: 45 | Fill #0

## 2019-09-10 MED FILL — ARIPIPRAZOLE 5 MG TABS: 5 | 30 days supply | Qty: 30 | Fill #0

## 2019-09-13 MED FILL — CloNIDine HCL 0.1 MG TAB: 0.1 | 30 days supply | Qty: 60 | Fill #1

## 2019-09-13 MED FILL — traZODone HCL 100 MG TABS: 100 | 30 days supply | Qty: 60 | Fill #1

## 2019-09-24 DIAGNOSIS — F4312 Post-traumatic stress disorder, chronic: Secondary | ICD-10-CM | POA: Diagnosis not present

## 2019-09-24 DIAGNOSIS — M5137 Other intervertebral disc degeneration, lumbosacral region: Secondary | ICD-10-CM | POA: Diagnosis not present

## 2019-09-24 DIAGNOSIS — Z79899 Other long term (current) drug therapy: Secondary | ICD-10-CM | POA: Diagnosis not present

## 2019-09-24 MED FILL — oxyCODONE HCL 15 MG TABS: 15 | 30 days supply | Qty: 120 | Fill #0

## 2019-09-24 MED FILL — VIT D2 1.25 MG (50,000 UNIT: 1.25 MG | 28 days supply | Qty: 4 | Fill #3

## 2019-10-14 ENCOUNTER — Other Ambulatory Visit (HOSPITAL_COMMUNITY): Payer: Self-pay | Admitting: Physician Assistant

## 2019-10-14 DIAGNOSIS — F339 Major depressive disorder, recurrent, unspecified: Secondary | ICD-10-CM | POA: Diagnosis not present

## 2019-10-14 DIAGNOSIS — E1169 Type 2 diabetes mellitus with other specified complication: Secondary | ICD-10-CM | POA: Diagnosis not present

## 2019-10-14 DIAGNOSIS — K219 Gastro-esophageal reflux disease without esophagitis: Secondary | ICD-10-CM | POA: Diagnosis not present

## 2019-10-14 DIAGNOSIS — E7800 Pure hypercholesterolemia, unspecified: Secondary | ICD-10-CM | POA: Diagnosis not present

## 2019-10-14 DIAGNOSIS — F419 Anxiety disorder, unspecified: Secondary | ICD-10-CM | POA: Diagnosis not present

## 2019-10-14 DIAGNOSIS — Z0000 Encounter for general adult medical examination without abnormal findings: Secondary | ICD-10-CM | POA: Diagnosis not present

## 2019-10-14 DIAGNOSIS — M5136 Other intervertebral disc degeneration, lumbar region: Secondary | ICD-10-CM | POA: Diagnosis not present

## 2019-10-14 DIAGNOSIS — J309 Allergic rhinitis, unspecified: Secondary | ICD-10-CM | POA: Diagnosis not present

## 2019-10-14 DIAGNOSIS — Z23 Encounter for immunization: Secondary | ICD-10-CM | POA: Diagnosis not present

## 2019-10-14 DIAGNOSIS — Z125 Encounter for screening for malignant neoplasm of prostate: Secondary | ICD-10-CM | POA: Diagnosis not present

## 2019-10-14 DIAGNOSIS — E559 Vitamin D deficiency, unspecified: Secondary | ICD-10-CM | POA: Diagnosis not present

## 2019-10-14 MED FILL — ARIPIPRAZOLE 5 MG TABS: 5 | 90 days supply | Qty: 90 | Fill #0

## 2019-10-14 MED FILL — PRAVASTATIN NA 10 MG TAB: 10 | 90 days supply | Qty: 90 | Fill #0

## 2019-10-14 MED FILL — SERTRALINE HCL 100 MG TABS: 100 | 90 days supply | Qty: 135 | Fill #0

## 2019-10-14 MED FILL — METFORMIN HCL 1000 MG TABS: 1000 | 90 days supply | Qty: 180 | Fill #0

## 2019-10-16 MED FILL — VIT D2 1.25 MG (50,000 UNIT: 1.25 MG | 84 days supply | Qty: 12 | Fill #0

## 2019-10-25 DIAGNOSIS — Z79899 Other long term (current) drug therapy: Secondary | ICD-10-CM | POA: Diagnosis not present

## 2019-10-25 DIAGNOSIS — I10 Essential (primary) hypertension: Secondary | ICD-10-CM | POA: Diagnosis not present

## 2019-10-25 DIAGNOSIS — F4312 Post-traumatic stress disorder, chronic: Secondary | ICD-10-CM | POA: Diagnosis not present

## 2019-10-25 DIAGNOSIS — M5137 Other intervertebral disc degeneration, lumbosacral region: Secondary | ICD-10-CM | POA: Diagnosis not present

## 2019-10-25 MED FILL — oxyCODONE HCL 15 MG TABS: 15 | 30 days supply | Qty: 150 | Fill #0

## 2019-11-24 DIAGNOSIS — E119 Type 2 diabetes mellitus without complications: Secondary | ICD-10-CM | POA: Diagnosis not present

## 2019-11-24 DIAGNOSIS — M5137 Other intervertebral disc degeneration, lumbosacral region: Secondary | ICD-10-CM | POA: Diagnosis not present

## 2019-11-24 DIAGNOSIS — Z79899 Other long term (current) drug therapy: Secondary | ICD-10-CM | POA: Diagnosis not present

## 2019-11-24 DIAGNOSIS — F4312 Post-traumatic stress disorder, chronic: Secondary | ICD-10-CM | POA: Diagnosis not present

## 2019-11-24 DIAGNOSIS — I10 Essential (primary) hypertension: Secondary | ICD-10-CM | POA: Diagnosis not present

## 2019-11-24 MED FILL — oxyCODONE HCL 15 MG TABS: 15 | 30 days supply | Qty: 150 | Fill #0

## 2019-12-03 ENCOUNTER — Other Ambulatory Visit (HOSPITAL_COMMUNITY): Payer: Self-pay | Admitting: Physician Assistant

## 2019-12-03 DIAGNOSIS — Z7984 Long term (current) use of oral hypoglycemic drugs: Secondary | ICD-10-CM | POA: Diagnosis not present

## 2019-12-03 DIAGNOSIS — Z23 Encounter for immunization: Secondary | ICD-10-CM | POA: Diagnosis not present

## 2019-12-03 DIAGNOSIS — R933 Abnormal findings on diagnostic imaging of other parts of digestive tract: Secondary | ICD-10-CM | POA: Diagnosis not present

## 2019-12-03 DIAGNOSIS — E1169 Type 2 diabetes mellitus with other specified complication: Secondary | ICD-10-CM | POA: Diagnosis not present

## 2019-12-03 DIAGNOSIS — M109 Gout, unspecified: Secondary | ICD-10-CM | POA: Diagnosis not present

## 2019-12-03 MED FILL — LOSARTAN POTASSIUM 25 MG TA: 25 | 90 days supply | Qty: 90 | Fill #0

## 2019-12-06 ENCOUNTER — Other Ambulatory Visit (HOSPITAL_COMMUNITY): Payer: Self-pay | Admitting: Physician Assistant

## 2019-12-06 MED FILL — ALLOPURINOL 100 MG TABS: 100 | 30 days supply | Qty: 30 | Fill #0

## 2019-12-24 ENCOUNTER — Other Ambulatory Visit (HOSPITAL_COMMUNITY): Payer: Self-pay | Admitting: Family Medicine

## 2019-12-24 DIAGNOSIS — M5137 Other intervertebral disc degeneration, lumbosacral region: Secondary | ICD-10-CM | POA: Diagnosis not present

## 2019-12-24 DIAGNOSIS — Z79899 Other long term (current) drug therapy: Secondary | ICD-10-CM | POA: Diagnosis not present

## 2019-12-24 DIAGNOSIS — E119 Type 2 diabetes mellitus without complications: Secondary | ICD-10-CM | POA: Diagnosis not present

## 2019-12-24 MED FILL — oxyCODONE HCL 15 MG TABS: 15 | 30 days supply | Qty: 150 | Fill #0

## 2019-12-24 MED FILL — LOSARTAN POTASSIUM 25 MG TA: 25 | 90 days supply | Qty: 90 | Fill #0

## 2019-12-24 MED FILL — ALLOPURINOL 100 MG TABS: 100 | 30 days supply | Qty: 30 | Fill #0

## 2020-01-14 MED FILL — SERTRALINE HCL 100 MG TABS: 100 | 90 days supply | Qty: 135 | Fill #1

## 2020-01-14 MED FILL — PRAVASTATIN NA 10 MG TAB: 10 | 90 days supply | Qty: 90 | Fill #1

## 2020-01-14 MED FILL — ARIPIPRAZOLE 5 MG TABS: 5 | 90 days supply | Qty: 90 | Fill #1

## 2020-01-22 MED FILL — ALLOPURINOL 100 MG TABS: 100 | 30 days supply | Qty: 30 | Fill #1

## 2020-01-24 ENCOUNTER — Other Ambulatory Visit (HOSPITAL_COMMUNITY): Payer: Self-pay | Admitting: Family Medicine

## 2020-01-24 DIAGNOSIS — Z79899 Other long term (current) drug therapy: Secondary | ICD-10-CM | POA: Diagnosis not present

## 2020-01-24 DIAGNOSIS — E119 Type 2 diabetes mellitus without complications: Secondary | ICD-10-CM | POA: Diagnosis not present

## 2020-01-24 DIAGNOSIS — M5137 Other intervertebral disc degeneration, lumbosacral region: Secondary | ICD-10-CM | POA: Diagnosis not present

## 2020-01-24 MED FILL — oxyCODONE HCL 15 MG TABS: 15 | 30 days supply | Qty: 150 | Fill #0

## 2020-02-15 MED FILL — METFORMIN HCL 1000 MG TABS: 1000 | 90 days supply | Qty: 180 | Fill #1

## 2020-02-23 ENCOUNTER — Other Ambulatory Visit (HOSPITAL_COMMUNITY): Payer: Self-pay | Admitting: Family Medicine

## 2020-02-23 DIAGNOSIS — M5137 Other intervertebral disc degeneration, lumbosacral region: Secondary | ICD-10-CM | POA: Diagnosis not present

## 2020-02-23 DIAGNOSIS — Z79899 Other long term (current) drug therapy: Secondary | ICD-10-CM | POA: Diagnosis not present

## 2020-02-23 DIAGNOSIS — E119 Type 2 diabetes mellitus without complications: Secondary | ICD-10-CM | POA: Diagnosis not present

## 2020-02-23 MED FILL — oxyCODONE HCL 15 MG TABS: 15 | 30 days supply | Qty: 150 | Fill #0

## 2020-02-23 MED FILL — ALLOPURINOL 100 MG TABS: 100 | 30 days supply | Qty: 30 | Fill #2

## 2020-03-04 DEATH — deceased

## 2020-03-15 DIAGNOSIS — M109 Gout, unspecified: Secondary | ICD-10-CM | POA: Diagnosis not present

## 2020-03-23 ENCOUNTER — Other Ambulatory Visit (HOSPITAL_COMMUNITY): Payer: Self-pay | Admitting: Physician Assistant

## 2020-03-23 DIAGNOSIS — M5137 Other intervertebral disc degeneration, lumbosacral region: Secondary | ICD-10-CM | POA: Diagnosis not present

## 2020-03-23 DIAGNOSIS — E119 Type 2 diabetes mellitus without complications: Secondary | ICD-10-CM | POA: Diagnosis not present

## 2020-03-23 DIAGNOSIS — Z79899 Other long term (current) drug therapy: Secondary | ICD-10-CM | POA: Diagnosis not present

## 2020-03-23 MED FILL — oxyCODONE HCL 15 MG TABS: 15 | 30 days supply | Qty: 150 | Fill #0

## 2020-03-23 MED FILL — LOSARTAN POTASSIUM 25 MG TA: 25 | 90 days supply | Qty: 90 | Fill #1

## 2020-03-23 MED FILL — ALLOPURINOL 100 MG TABS: 100 | 30 days supply | Qty: 30 | Fill #0

## 2020-04-12 MED FILL — SERTRALINE HCL 100 MG TABS: 100 | 13 days supply | Qty: 20 | Fill #2

## 2020-04-14 ENCOUNTER — Other Ambulatory Visit (HOSPITAL_COMMUNITY): Payer: Self-pay | Admitting: Physician Assistant

## 2020-04-14 MED FILL — PRAVASTATIN NA 10 MG TAB: 10 | 90 days supply | Qty: 90 | Fill #0

## 2020-04-14 MED FILL — ARIPIPRAZOLE 5 MG TABS: 5 | 90 days supply | Qty: 90 | Fill #0

## 2020-04-19 DIAGNOSIS — K219 Gastro-esophageal reflux disease without esophagitis: Secondary | ICD-10-CM | POA: Diagnosis not present

## 2020-04-19 DIAGNOSIS — M109 Gout, unspecified: Secondary | ICD-10-CM | POA: Diagnosis not present

## 2020-04-19 DIAGNOSIS — E1169 Type 2 diabetes mellitus with other specified complication: Secondary | ICD-10-CM | POA: Diagnosis not present

## 2020-04-19 DIAGNOSIS — F339 Major depressive disorder, recurrent, unspecified: Secondary | ICD-10-CM | POA: Diagnosis not present

## 2020-04-19 DIAGNOSIS — E7800 Pure hypercholesterolemia, unspecified: Secondary | ICD-10-CM | POA: Diagnosis not present

## 2020-04-19 DIAGNOSIS — F419 Anxiety disorder, unspecified: Secondary | ICD-10-CM | POA: Diagnosis not present

## 2020-04-21 ENCOUNTER — Other Ambulatory Visit (HOSPITAL_COMMUNITY): Payer: Self-pay | Admitting: Family Medicine

## 2020-04-21 DIAGNOSIS — Z79899 Other long term (current) drug therapy: Secondary | ICD-10-CM | POA: Diagnosis not present

## 2020-04-21 DIAGNOSIS — E119 Type 2 diabetes mellitus without complications: Secondary | ICD-10-CM | POA: Diagnosis not present

## 2020-04-21 DIAGNOSIS — M5137 Other intervertebral disc degeneration, lumbosacral region: Secondary | ICD-10-CM | POA: Diagnosis not present

## 2020-04-21 MED FILL — OXYCODONE HCL 20 MG TABS: 20 | 30 days supply | Qty: 120 | Fill #0

## 2020-04-24 MED FILL — ALLOPURINOL 100 MG TABS: 100 | 30 days supply | Qty: 30 | Fill #1

## 2020-05-01 ENCOUNTER — Telehealth: Payer: Self-pay | Admitting: Gastroenterology

## 2020-05-01 ENCOUNTER — Other Ambulatory Visit (HOSPITAL_COMMUNITY): Payer: Self-pay | Admitting: Physician Assistant

## 2020-05-01 MED FILL — SERTRALINE HCL 100 MG TABS: 100 | 90 days supply | Qty: 135 | Fill #0

## 2020-05-01 NOTE — Telephone Encounter (Signed)
Good afternoon Dr. Havery Moros, we received a referral for patient to have a colonoscopy, but he did have a colon in 2018.  Records printed and sent to you for review.  Can you please advise on scheduling?  Thank you.

## 2020-05-02 NOTE — Telephone Encounter (Signed)
I will review records and make a recommendation. Thank you

## 2020-05-03 ENCOUNTER — Encounter: Payer: Self-pay | Admitting: Gastroenterology

## 2020-05-03 NOTE — Telephone Encounter (Signed)
Per Dr. Havery Moros ok to schedule colonoscopy.  Called patient and schedules for 3/4 and 3/18.

## 2020-05-05 ENCOUNTER — Ambulatory Visit (AMBULATORY_SURGERY_CENTER): Payer: 59 | Admitting: *Deleted

## 2020-05-05 ENCOUNTER — Other Ambulatory Visit: Payer: Self-pay

## 2020-05-05 ENCOUNTER — Other Ambulatory Visit: Payer: Self-pay | Admitting: Gastroenterology

## 2020-05-05 VITALS — Ht 70.0 in | Wt 229.0 lb

## 2020-05-05 DIAGNOSIS — Z86010 Personal history of colonic polyps: Secondary | ICD-10-CM

## 2020-05-05 DIAGNOSIS — Z800 Family history of malignant neoplasm of digestive organs: Secondary | ICD-10-CM

## 2020-05-05 MED ORDER — SUPREP BOWEL PREP KIT 17.5-3.13-1.6 GM/177ML PO SOLN: 1.0000 | Freq: Once | ORAL | 0 refills | Status: DC

## 2020-05-05 MED FILL — SUPREP BOWEL PREP KIT: 17.5-3.13-1 | 1 days supply | Qty: 354 | Fill #0

## 2020-05-05 NOTE — Progress Notes (Signed)
No egg or soy allergy known to patient   issues with past sedation with any surgeries or procedures of panic attacks post op - had to re sedate with shoulder surgery due to panic attack  No intubation problems in the past  No FH of Malignant Hyperthermia No diet pills per patient No home 02 use per patient  No blood thinners per patient  Pt denies issues with constipation  No A fib or A flutter  EMMI video to pt or via [facility] 19 guidelines implemented in PV today with Pt and RN  Pt is fully vaccinated  for Covid   Due to the COVID-19 pandemic we are asking patients to follow certain guidelines.  Pt aware of COVID protocols and [facility] guidelines   Pt verified name, DOB, address and insurance during PV today. Pt mailed instruction packet to included paper to complete and mail back to [facility] with addressed and stamped envelope, Emmi video, copy of consent form to read and not return, and instructions. PV completed over the phone. Pt encouraged to call with questions or issues

## 2020-05-08 ENCOUNTER — Encounter: Payer: Self-pay | Admitting: Gastroenterology

## 2020-05-18 ENCOUNTER — Other Ambulatory Visit (HOSPITAL_COMMUNITY): Payer: Self-pay | Admitting: Family Medicine

## 2020-05-18 DIAGNOSIS — I10 Essential (primary) hypertension: Secondary | ICD-10-CM | POA: Diagnosis not present

## 2020-05-18 DIAGNOSIS — M5137 Other intervertebral disc degeneration, lumbosacral region: Secondary | ICD-10-CM | POA: Diagnosis not present

## 2020-05-18 DIAGNOSIS — F17200 Nicotine dependence, unspecified, uncomplicated: Secondary | ICD-10-CM | POA: Diagnosis not present

## 2020-05-18 DIAGNOSIS — E119 Type 2 diabetes mellitus without complications: Secondary | ICD-10-CM | POA: Diagnosis not present

## 2020-05-18 DIAGNOSIS — Z79899 Other long term (current) drug therapy: Secondary | ICD-10-CM | POA: Diagnosis not present

## 2020-05-18 DIAGNOSIS — F17210 Nicotine dependence, cigarettes, uncomplicated: Secondary | ICD-10-CM | POA: Diagnosis not present

## 2020-05-19 ENCOUNTER — Ambulatory Visit (AMBULATORY_SURGERY_CENTER): Payer: 59 | Admitting: Gastroenterology

## 2020-05-19 ENCOUNTER — Other Ambulatory Visit: Payer: Self-pay

## 2020-05-19 ENCOUNTER — Encounter: Payer: Self-pay | Admitting: Gastroenterology

## 2020-05-19 VITALS — BP 180/81 | HR 60 | Temp 97.3°F | Resp 18 | Ht 70.0 in | Wt 229.0 lb

## 2020-05-19 DIAGNOSIS — D120 Benign neoplasm of cecum: Secondary | ICD-10-CM

## 2020-05-19 DIAGNOSIS — Z86010 Personal history of colonic polyps: Secondary | ICD-10-CM | POA: Diagnosis not present

## 2020-05-19 DIAGNOSIS — D122 Benign neoplasm of ascending colon: Secondary | ICD-10-CM | POA: Diagnosis not present

## 2020-05-19 DIAGNOSIS — D128 Benign neoplasm of rectum: Secondary | ICD-10-CM

## 2020-05-19 DIAGNOSIS — K621 Rectal polyp: Secondary | ICD-10-CM | POA: Diagnosis not present

## 2020-05-19 DIAGNOSIS — D123 Benign neoplasm of transverse colon: Secondary | ICD-10-CM | POA: Diagnosis not present

## 2020-05-19 DIAGNOSIS — D125 Benign neoplasm of sigmoid colon: Secondary | ICD-10-CM

## 2020-05-19 DIAGNOSIS — Z800 Family history of malignant neoplasm of digestive organs: Secondary | ICD-10-CM | POA: Diagnosis not present

## 2020-05-19 DIAGNOSIS — D127 Benign neoplasm of rectosigmoid junction: Secondary | ICD-10-CM

## 2020-05-19 DIAGNOSIS — Z1211 Encounter for screening for malignant neoplasm of colon: Secondary | ICD-10-CM | POA: Diagnosis not present

## 2020-05-19 MED ORDER — SODIUM CHLORIDE 0.9 % IV SOLN: 500.0000 mL | Freq: Once | INTRAVENOUS | Status: DC

## 2020-05-19 NOTE — Progress Notes (Signed)
Called to room to assist during endoscopic procedure.  Patient ID and intended procedure confirmed with present staff. Received instructions for my participation in the procedure from the performing physician.  

## 2020-05-19 NOTE — Patient Instructions (Signed)
YOU HAD AN ENDOSCOPIC PROCEDURE TODAY AT THE [facility]:   Refer to the procedure report that was given to you for any specific questions about what was found during the examination.  If the procedure report does not answer your questions, please call your gastroenterologist to clarify.  If you requested that your care partner not be given the details of your procedure findings, then the procedure report has been included in a sealed envelope for you to review at your convenience later.  YOU SHOULD EXPECT: Some feelings of bloating in the abdomen. Passage of more gas than usual.  Walking can help get rid of the air that was put into your GI tract during the procedure and reduce the bloating. If you had a lower endoscopy (such as a colonoscopy or flexible sigmoidoscopy) you may notice spotting of blood in your stool or on the toilet paper. If you underwent a bowel prep for your procedure, you may not have a normal bowel movement for a few days.  Please Note:  You might notice some irritation and congestion in your nose or some drainage.  This is from the oxygen used during your procedure.  There is no need for concern and it should clear up in a day or so.  SYMPTOMS TO REPORT IMMEDIATELY:   Following lower endoscopy (colonoscopy or flexible sigmoidoscopy):  Excessive amounts of blood in the stool  Significant tenderness or worsening of abdominal pains  Swelling of the abdomen that is new, acute  Fever of 100F or higher   Following upper endoscopy (EGD)  Vomiting of blood or coffee ground material  New chest pain or pain under the shoulder blades  Painful or persistently difficult swallowing  New shortness of breath  Fever of 100F or higher  Black, tarry-looking stools  For urgent or emergent issues, a gastroenterologist can be reached at any hour by calling [phone]. Do not use MyChart messaging for urgent concerns.    DIET:  We do recommend a small meal at first, but  then you may proceed to your regular diet.  Drink plenty of fluids but you should avoid alcoholic beverages for 24 hours.  ACTIVITY:  You should plan to take it easy for the rest of today and you should NOT DRIVE or use heavy machinery until tomorrow (because of the sedation medicines used during the test).    FOLLOW UP: Our staff will call the number listed on your records 48-72 hours following your procedure to check on you and address any questions or concerns that you may have regarding the information given to you following your procedure. If we do not reach you, we will leave a message.  We will attempt to reach you two times.  During this call, we will ask if you have developed any symptoms of COVID 19. If you develop any symptoms (ie: fever, flu-like symptoms, shortness of breath, cough etc.) before then, please call [phone].  If you test positive for Covid 19 in the 2 weeks post procedure, please call and report this information to us.    If any biopsies were taken you will be contacted by phone or by letter within the next 1-3 weeks.  Please call us at [phone] if you have not heard about the biopsies in 3 weeks.    SIGNATURES/CONFIDENTIALITY: You and/or your care partner have signed paperwork which will be entered into your electronic medical record.  These signatures attest to the fact that that the information above on   your After Visit Summary has been reviewed and is understood.  Full responsibility of the confidentiality of this discharge information lies with you and/or your care-partner. 

## 2020-05-19 NOTE — Op Note (Signed)
[place] Patient Name: Brax Walen Procedure Date: 05/19/2020 8:28 AM MRN: [id number] Endoscopist: Remo Lipps P. Havery Moros , MD Age: 55 Referring MD:  Date of Birth: 1965/08/04 Gender: Male Account #: [id number] Procedure:                Colonoscopy Indications:              High risk colon cancer surveillance: Personal                            history of colonic polyps (serrated adenoma removed                            in 2018 in setting of poor prep, both mother and                            brother with CRC dx age 16, double prep used for                            this exam Medicines:                Monitored Anesthesia Care Procedure:                Pre-Anesthesia Assessment:                           - Prior to the procedure, a History and Physical                            was performed, and patient medications and                            allergies were reviewed. The patient's tolerance of                            previous anesthesia was also reviewed. The risks                            and benefits of the procedure and the sedation                            options and risks were discussed with the patient.                            All questions were answered, and informed consent                            was obtained. Prior Anticoagulants: The patient has                            taken no previous anticoagulant or antiplatelet                            agents. ASA Grade Assessment: III - A patient with  severe systemic disease. After reviewing the risks                            and benefits, the patient was deemed in                            satisfactory condition to undergo the procedure.                           After obtaining informed consent, the colonoscope                            was passed under direct vision. Throughout the                            procedure, the patient's blood pressure, pulse, and                             oxygen saturations were monitored continuously. The                            Olympus CF-HQ190 [id number]) Colonoscope was                            introduced through the anus and advanced to the the                            cecum, identified by appendiceal orifice and                            ileocecal valve. The colonoscopy was performed                            without difficulty. The patient tolerated the                            procedure well. The quality of the bowel                            preparation was adequate. The ileocecal valve,                            appendiceal orifice, and rectum were photographed. Scope In: 8:33:52 AM Scope Out: 9:06:01 AM Scope Withdrawal Time: 0 hours 27 minutes 42 seconds  Total Procedure Duration: 0 hours 32 minutes 9 seconds  Findings:                 The perianal and digital rectal examinations were                            normal.                           There was spasm in the entire colon which prolonged  the exam. The prep was fair upon insertion, several                            minutes spent lavaging the right colon to achieve                            adequate views.                           Two sessile polyps were found in the cecum. The                            polyps were 3 mm in size. These polyps were removed                            with a cold snare. Resection and retrieval were                            complete.                           Two sessile polyps were found in the ascending                            colon. The polyps were 5 to 6 mm in size. These                            polyps were removed with a cold snare. Resection                            and retrieval were complete.                           Three sessile polyps were found in the transverse                            colon. The polyps were 4 to 6 mm in size. These                             polyps were removed with a cold snare. Resection                            and retrieval were complete.                           A 15 mm polyp was found in the transverse colon.                            The polyp was pedunculated. The polyp was removed                            with a hot snare. Resection and retrieval were  complete.                           A 5 mm polyp was found in the sigmoid colon. The                            polyp was sessile. The polyp was removed with a                            cold snare. Resection and retrieval were complete.                           Multiple benign hyperplastic appearing polps noted                            in the rectum / rectosigmoid colon. The polyps were                            3 to 4 mm in size. Three representative polyps were                            removed with a cold snare. Resection and retrieval                            were complete.                           Multiple medium-mouthed diverticula were found in                            the sigmoid colon.                           Internal hemorrhoids were found during retroflexion.                           The exam was otherwise without abnormality. Complications:            No immediate complications. Estimated blood loss:                            Minimal. Estimated Blood Loss:     Estimated blood loss was minimal. Impression:               - Colonic spasm.                           - Two 3 mm polyps in the cecum, removed with a cold                            snare. Resected and retrieved.                           - Two 5 to 6 mm polyps in the ascending colon,  removed with a cold snare. Resected and retrieved.                           - Three 4 to 6 mm polyps in the transverse colon,                            removed with a cold snare. Resected and retrieved.                           - One  15 mm polyp in the transverse colon, removed                            with a hot snare. Resected and retrieved.                           - One 5 mm polyp in the sigmoid colon, removed with                            a cold snare. Resected and retrieved.                           - Suspected benign hyperplastic polyps in the left                            colon, three representative polyps removed with a                            cold snare. Resected and retrieved.                           - Diverticulosis in the sigmoid colon.                           - Internal hemorrhoids.                           - The examination was otherwise normal. Recommendation:           - Patient has a contact number available for                            emergencies. The signs and symptoms of potential                            delayed complications were discussed with the                            patient. Return to normal activities tomorrow.                            Written discharge instructions were provided to the                            patient.                           -  Resume previous diet.                           - Continue present medications.                           - Await pathology results.                           - No ibuprofen, naproxen, or other non-steroidal                            anti-inflammatory drugs for 2 weeks after polyp                            removal. Remo Lipps P. Maycee Blasco, MD 05/19/2020 9:15:07 AM This report has been signed electronically.

## 2020-05-19 NOTE — Progress Notes (Signed)
To pacu, vss. Report to [email or web address] 

## 2020-05-19 NOTE — Progress Notes (Signed)
VS by CW  I have reviewed the patient's medical history in detail and updated the computerized patient record.  

## 2020-05-23 ENCOUNTER — Telehealth: Payer: Self-pay

## 2020-05-23 NOTE — Telephone Encounter (Signed)
No answer, left message to call back later today, B.Brucha Ahlquist RN. 

## 2020-05-23 NOTE — Telephone Encounter (Signed)
Left message on follow up call. 

## 2020-05-24 ENCOUNTER — Telehealth: Payer: Self-pay | Admitting: Gastroenterology

## 2020-05-24 NOTE — Telephone Encounter (Signed)
Error

## 2020-05-24 NOTE — Telephone Encounter (Signed)
Patient called to follow up on results stated he still feels a bit fatigued but is well otherwise.

## 2020-05-31 ENCOUNTER — Telehealth: Payer: Self-pay | Admitting: Gastroenterology

## 2020-05-31 NOTE — Telephone Encounter (Signed)
Spoke with patient in regards to his pathology results. Advised patient that Dr. Havery Moros did send him a message via My Chart with the results, I read the message for patient. He verbalized understanding of all information and had no concerns at the end of the call.

## 2020-05-31 NOTE — Telephone Encounter (Signed)
Patient calling for path results 

## 2020-06-14 ENCOUNTER — Other Ambulatory Visit (HOSPITAL_COMMUNITY): Payer: Self-pay

## 2020-06-14 DIAGNOSIS — I10 Essential (primary) hypertension: Secondary | ICD-10-CM | POA: Diagnosis not present

## 2020-06-14 DIAGNOSIS — F17200 Nicotine dependence, unspecified, uncomplicated: Secondary | ICD-10-CM | POA: Diagnosis not present

## 2020-06-14 DIAGNOSIS — M5137 Other intervertebral disc degeneration, lumbosacral region: Secondary | ICD-10-CM | POA: Diagnosis not present

## 2020-06-14 DIAGNOSIS — F17210 Nicotine dependence, cigarettes, uncomplicated: Secondary | ICD-10-CM | POA: Diagnosis not present

## 2020-06-14 DIAGNOSIS — Z79899 Other long term (current) drug therapy: Secondary | ICD-10-CM | POA: Diagnosis not present

## 2020-06-14 DIAGNOSIS — E119 Type 2 diabetes mellitus without complications: Secondary | ICD-10-CM | POA: Diagnosis not present

## 2020-06-14 MED ORDER — OXYCODONE HCL ER 10 MG PO T12A
10.0000 mg | EXTENDED_RELEASE_TABLET | Freq: Two times a day (BID) | ORAL | 0 refills | Status: DC
  Filled 2020-06-14: qty 60, 30d supply, fill #0

## 2020-06-14 MED ORDER — OXYCODONE HCL 20 MG PO TABS
20.0000 mg | ORAL_TABLET | Freq: Three times a day (TID) | ORAL | 0 refills | Status: DC
  Filled 2020-06-19: qty 90, 30d supply, fill #0

## 2020-06-14 MED ORDER — ALLOPURINOL 100 MG PO TABS
100.0000 mg | ORAL_TABLET | Freq: Every day | ORAL | 1 refills | Status: DC
  Filled 2020-06-14: qty 90, 90d supply, fill #0
  Filled 2020-10-16: qty 90, 90d supply, fill #1

## 2020-06-15 ENCOUNTER — Other Ambulatory Visit (HOSPITAL_COMMUNITY): Payer: Self-pay

## 2020-06-19 ENCOUNTER — Other Ambulatory Visit (HOSPITAL_COMMUNITY): Payer: Self-pay

## 2020-07-06 ENCOUNTER — Other Ambulatory Visit (HOSPITAL_COMMUNITY): Payer: Self-pay

## 2020-07-06 MED ORDER — METFORMIN HCL 1000 MG PO TABS
1.0000 | ORAL_TABLET | Freq: Two times a day (BID) | ORAL | 1 refills | Status: DC
  Filled 2020-07-06: qty 180, 90d supply, fill #0
  Filled 2021-01-08: qty 180, 90d supply, fill #1

## 2020-07-14 ENCOUNTER — Other Ambulatory Visit (HOSPITAL_COMMUNITY): Payer: Self-pay

## 2020-07-14 DIAGNOSIS — F17200 Nicotine dependence, unspecified, uncomplicated: Secondary | ICD-10-CM | POA: Diagnosis not present

## 2020-07-14 DIAGNOSIS — Z79899 Other long term (current) drug therapy: Secondary | ICD-10-CM | POA: Diagnosis not present

## 2020-07-14 DIAGNOSIS — M5137 Other intervertebral disc degeneration, lumbosacral region: Secondary | ICD-10-CM | POA: Diagnosis not present

## 2020-07-14 DIAGNOSIS — I10 Essential (primary) hypertension: Secondary | ICD-10-CM | POA: Diagnosis not present

## 2020-07-14 DIAGNOSIS — E119 Type 2 diabetes mellitus without complications: Secondary | ICD-10-CM | POA: Diagnosis not present

## 2020-07-14 DIAGNOSIS — F17210 Nicotine dependence, cigarettes, uncomplicated: Secondary | ICD-10-CM | POA: Diagnosis not present

## 2020-07-14 MED ORDER — OXYCODONE HCL ER 10 MG PO T12A
EXTENDED_RELEASE_TABLET | ORAL | 0 refills | Status: DC
  Filled 2020-07-14: qty 60, 30d supply, fill #0

## 2020-07-14 MED ORDER — OXYCODONE HCL 20 MG PO TABS
ORAL_TABLET | ORAL | 0 refills | Status: DC
  Filled 2020-07-14 – 2020-07-17 (×2): qty 90, 30d supply, fill #0

## 2020-07-17 ENCOUNTER — Other Ambulatory Visit (HOSPITAL_COMMUNITY): Payer: Self-pay

## 2020-07-17 MED FILL — Losartan Potassium Tab 25 MG: ORAL | 90 days supply | Qty: 90 | Fill #0 | Status: AC

## 2020-07-21 DIAGNOSIS — H52203 Unspecified astigmatism, bilateral: Secondary | ICD-10-CM | POA: Diagnosis not present

## 2020-08-08 ENCOUNTER — Other Ambulatory Visit (HOSPITAL_COMMUNITY): Payer: Self-pay

## 2020-08-09 ENCOUNTER — Other Ambulatory Visit (HOSPITAL_COMMUNITY): Payer: Self-pay

## 2020-08-09 MED ORDER — ARIPIPRAZOLE 5 MG PO TABS
5.0000 mg | ORAL_TABLET | Freq: Every day | ORAL | 0 refills | Status: DC
  Filled 2020-08-09: qty 90, 90d supply, fill #0

## 2020-08-09 MED ORDER — PRAVASTATIN SODIUM 10 MG PO TABS
10.0000 mg | ORAL_TABLET | Freq: Every day | ORAL | 0 refills | Status: DC
  Filled 2020-08-09: qty 90, 90d supply, fill #0

## 2020-08-11 ENCOUNTER — Other Ambulatory Visit (HOSPITAL_COMMUNITY): Payer: Self-pay

## 2020-08-14 ENCOUNTER — Other Ambulatory Visit (HOSPITAL_COMMUNITY): Payer: Self-pay

## 2020-08-14 DIAGNOSIS — F17200 Nicotine dependence, unspecified, uncomplicated: Secondary | ICD-10-CM | POA: Diagnosis not present

## 2020-08-14 DIAGNOSIS — Z79899 Other long term (current) drug therapy: Secondary | ICD-10-CM | POA: Diagnosis not present

## 2020-08-14 DIAGNOSIS — F17210 Nicotine dependence, cigarettes, uncomplicated: Secondary | ICD-10-CM | POA: Diagnosis not present

## 2020-08-14 DIAGNOSIS — E119 Type 2 diabetes mellitus without complications: Secondary | ICD-10-CM | POA: Diagnosis not present

## 2020-08-14 DIAGNOSIS — M109 Gout, unspecified: Secondary | ICD-10-CM | POA: Diagnosis not present

## 2020-08-14 DIAGNOSIS — M5137 Other intervertebral disc degeneration, lumbosacral region: Secondary | ICD-10-CM | POA: Diagnosis not present

## 2020-08-14 MED ORDER — OXYCODONE HCL 20 MG PO TABS
ORAL_TABLET | ORAL | 0 refills | Status: DC
  Filled 2020-08-14: qty 90, 30d supply, fill #0

## 2020-08-14 MED ORDER — OXYCODONE HCL ER 10 MG PO T12A
EXTENDED_RELEASE_TABLET | ORAL | 0 refills | Status: DC
  Filled 2020-08-14: qty 60, 30d supply, fill #0

## 2020-08-18 DIAGNOSIS — Z79899 Other long term (current) drug therapy: Secondary | ICD-10-CM | POA: Diagnosis not present

## 2020-09-05 ENCOUNTER — Other Ambulatory Visit (HOSPITAL_COMMUNITY): Payer: Self-pay

## 2020-09-06 ENCOUNTER — Other Ambulatory Visit (HOSPITAL_COMMUNITY): Payer: Self-pay

## 2020-09-06 MED ORDER — INDOMETHACIN 50 MG PO CAPS
50.0000 mg | ORAL_CAPSULE | Freq: Three times a day (TID) | ORAL | 0 refills | Status: AC | PRN
  Filled 2020-09-06: qty 20, 7d supply, fill #0

## 2020-09-12 ENCOUNTER — Other Ambulatory Visit (HOSPITAL_COMMUNITY): Payer: Self-pay

## 2020-09-12 DIAGNOSIS — M5137 Other intervertebral disc degeneration, lumbosacral region: Secondary | ICD-10-CM | POA: Diagnosis not present

## 2020-09-12 DIAGNOSIS — F17200 Nicotine dependence, unspecified, uncomplicated: Secondary | ICD-10-CM | POA: Diagnosis not present

## 2020-09-12 DIAGNOSIS — F17210 Nicotine dependence, cigarettes, uncomplicated: Secondary | ICD-10-CM | POA: Diagnosis not present

## 2020-09-12 DIAGNOSIS — M109 Gout, unspecified: Secondary | ICD-10-CM | POA: Diagnosis not present

## 2020-09-12 DIAGNOSIS — Z79899 Other long term (current) drug therapy: Secondary | ICD-10-CM | POA: Diagnosis not present

## 2020-09-12 DIAGNOSIS — E119 Type 2 diabetes mellitus without complications: Secondary | ICD-10-CM | POA: Diagnosis not present

## 2020-09-12 MED ORDER — OXYCODONE HCL 20 MG PO TABS
ORAL_TABLET | ORAL | 0 refills | Status: DC
  Filled 2020-09-12 – 2020-09-13 (×2): qty 90, 30d supply, fill #0

## 2020-09-12 MED ORDER — OXYCODONE HCL ER 10 MG PO T12A
EXTENDED_RELEASE_TABLET | ORAL | 0 refills | Status: AC
  Filled 2020-09-13: qty 60, 30d supply, fill #0

## 2020-09-12 MED FILL — Sertraline HCl Tab 100 MG: ORAL | 90 days supply | Qty: 135 | Fill #0 | Status: AC

## 2020-09-13 ENCOUNTER — Other Ambulatory Visit (HOSPITAL_COMMUNITY): Payer: Self-pay

## 2020-09-14 ENCOUNTER — Other Ambulatory Visit (HOSPITAL_COMMUNITY): Payer: Self-pay

## 2020-09-14 DIAGNOSIS — Z79899 Other long term (current) drug therapy: Secondary | ICD-10-CM | POA: Diagnosis not present

## 2020-09-15 ENCOUNTER — Other Ambulatory Visit (HOSPITAL_COMMUNITY): Payer: Self-pay

## 2020-10-16 ENCOUNTER — Other Ambulatory Visit (HOSPITAL_COMMUNITY): Payer: Self-pay

## 2020-10-16 DIAGNOSIS — F17210 Nicotine dependence, cigarettes, uncomplicated: Secondary | ICD-10-CM | POA: Diagnosis not present

## 2020-10-16 DIAGNOSIS — E119 Type 2 diabetes mellitus without complications: Secondary | ICD-10-CM | POA: Diagnosis not present

## 2020-10-16 DIAGNOSIS — F17200 Nicotine dependence, unspecified, uncomplicated: Secondary | ICD-10-CM | POA: Diagnosis not present

## 2020-10-16 DIAGNOSIS — Z79899 Other long term (current) drug therapy: Secondary | ICD-10-CM | POA: Diagnosis not present

## 2020-10-16 DIAGNOSIS — M5137 Other intervertebral disc degeneration, lumbosacral region: Secondary | ICD-10-CM | POA: Diagnosis not present

## 2020-10-16 DIAGNOSIS — M109 Gout, unspecified: Secondary | ICD-10-CM | POA: Diagnosis not present

## 2020-10-16 MED ORDER — OXYCODONE HCL 20 MG PO TABS
ORAL_TABLET | ORAL | 0 refills | Status: DC
  Filled 2020-10-16: qty 120, 30d supply, fill #0

## 2020-10-18 DIAGNOSIS — Z79899 Other long term (current) drug therapy: Secondary | ICD-10-CM | POA: Diagnosis not present

## 2020-11-03 ENCOUNTER — Other Ambulatory Visit (HOSPITAL_COMMUNITY): Payer: Self-pay

## 2020-11-03 MED FILL — Losartan Potassium Tab 25 MG: ORAL | 90 days supply | Qty: 90 | Fill #1 | Status: AC

## 2020-11-16 ENCOUNTER — Other Ambulatory Visit (HOSPITAL_COMMUNITY): Payer: Self-pay

## 2020-11-16 DIAGNOSIS — M25511 Pain in right shoulder: Secondary | ICD-10-CM | POA: Diagnosis not present

## 2020-11-16 DIAGNOSIS — M5137 Other intervertebral disc degeneration, lumbosacral region: Secondary | ICD-10-CM | POA: Diagnosis not present

## 2020-11-16 DIAGNOSIS — Z79899 Other long term (current) drug therapy: Secondary | ICD-10-CM | POA: Diagnosis not present

## 2020-11-16 MED ORDER — OXYCODONE HCL 20 MG PO TABS
ORAL_TABLET | ORAL | 0 refills | Status: DC
  Filled 2020-11-16: qty 120, 30d supply, fill #0

## 2020-11-16 MED ORDER — ARIPIPRAZOLE 5 MG PO TABS
5.0000 mg | ORAL_TABLET | Freq: Every day | ORAL | 0 refills | Status: DC
  Filled 2020-11-16: qty 90, 90d supply, fill #0

## 2020-11-16 MED ORDER — PRAVASTATIN SODIUM 10 MG PO TABS
10.0000 mg | ORAL_TABLET | Freq: Every day | ORAL | 0 refills | Status: DC
  Filled 2020-11-16: qty 90, 90d supply, fill #0

## 2020-11-20 DIAGNOSIS — Z79899 Other long term (current) drug therapy: Secondary | ICD-10-CM | POA: Diagnosis not present

## 2020-12-14 ENCOUNTER — Other Ambulatory Visit (HOSPITAL_COMMUNITY): Payer: Self-pay

## 2020-12-14 DIAGNOSIS — Z23 Encounter for immunization: Secondary | ICD-10-CM | POA: Diagnosis not present

## 2020-12-14 DIAGNOSIS — J309 Allergic rhinitis, unspecified: Secondary | ICD-10-CM | POA: Diagnosis not present

## 2020-12-14 DIAGNOSIS — E1169 Type 2 diabetes mellitus with other specified complication: Secondary | ICD-10-CM | POA: Diagnosis not present

## 2020-12-14 DIAGNOSIS — M109 Gout, unspecified: Secondary | ICD-10-CM | POA: Diagnosis not present

## 2020-12-14 DIAGNOSIS — F339 Major depressive disorder, recurrent, unspecified: Secondary | ICD-10-CM | POA: Diagnosis not present

## 2020-12-14 DIAGNOSIS — Z125 Encounter for screening for malignant neoplasm of prostate: Secondary | ICD-10-CM | POA: Diagnosis not present

## 2020-12-14 DIAGNOSIS — K219 Gastro-esophageal reflux disease without esophagitis: Secondary | ICD-10-CM | POA: Diagnosis not present

## 2020-12-14 DIAGNOSIS — E7800 Pure hypercholesterolemia, unspecified: Secondary | ICD-10-CM | POA: Diagnosis not present

## 2020-12-14 DIAGNOSIS — Z0000 Encounter for general adult medical examination without abnormal findings: Secondary | ICD-10-CM | POA: Diagnosis not present

## 2020-12-14 DIAGNOSIS — F419 Anxiety disorder, unspecified: Secondary | ICD-10-CM | POA: Diagnosis not present

## 2020-12-14 MED ORDER — FLUTICASONE-SALMETEROL 100-50 MCG/ACT IN AEPB
INHALATION_SPRAY | RESPIRATORY_TRACT | 3 refills | Status: AC
  Filled 2020-12-14: qty 180, 90d supply, fill #0
  Filled 2021-09-03: qty 180, 90d supply, fill #1

## 2020-12-21 ENCOUNTER — Other Ambulatory Visit (HOSPITAL_COMMUNITY): Payer: Self-pay

## 2020-12-21 DIAGNOSIS — F411 Generalized anxiety disorder: Secondary | ICD-10-CM | POA: Diagnosis not present

## 2020-12-21 DIAGNOSIS — Z79899 Other long term (current) drug therapy: Secondary | ICD-10-CM | POA: Diagnosis not present

## 2020-12-21 DIAGNOSIS — M5137 Other intervertebral disc degeneration, lumbosacral region: Secondary | ICD-10-CM | POA: Diagnosis not present

## 2020-12-21 DIAGNOSIS — M25511 Pain in right shoulder: Secondary | ICD-10-CM | POA: Diagnosis not present

## 2020-12-21 MED ORDER — OXYCODONE HCL 20 MG PO TABS
ORAL_TABLET | ORAL | 0 refills | Status: AC
  Filled 2020-12-21: qty 120, 30d supply, fill #0

## 2020-12-21 MED ORDER — DIAZEPAM 2 MG PO TABS
ORAL_TABLET | ORAL | 0 refills | Status: DC
  Filled 2020-12-21: qty 60, 30d supply, fill #0

## 2020-12-25 ENCOUNTER — Other Ambulatory Visit (HOSPITAL_COMMUNITY): Payer: Self-pay

## 2020-12-25 DIAGNOSIS — Z79899 Other long term (current) drug therapy: Secondary | ICD-10-CM | POA: Diagnosis not present

## 2021-01-08 ENCOUNTER — Other Ambulatory Visit (HOSPITAL_COMMUNITY): Payer: Self-pay

## 2021-01-20 ENCOUNTER — Other Ambulatory Visit (HOSPITAL_COMMUNITY): Payer: Self-pay

## 2021-01-22 ENCOUNTER — Other Ambulatory Visit (HOSPITAL_COMMUNITY): Payer: Self-pay

## 2021-01-22 DIAGNOSIS — M25511 Pain in right shoulder: Secondary | ICD-10-CM | POA: Diagnosis not present

## 2021-01-22 DIAGNOSIS — M5137 Other intervertebral disc degeneration, lumbosacral region: Secondary | ICD-10-CM | POA: Diagnosis not present

## 2021-01-22 DIAGNOSIS — F411 Generalized anxiety disorder: Secondary | ICD-10-CM | POA: Diagnosis not present

## 2021-01-22 DIAGNOSIS — Z79899 Other long term (current) drug therapy: Secondary | ICD-10-CM | POA: Diagnosis not present

## 2021-01-22 MED ORDER — DIAZEPAM 2 MG PO TABS
2.0000 mg | ORAL_TABLET | Freq: Two times a day (BID) | ORAL | 0 refills | Status: DC | PRN
  Filled 2021-01-22: qty 6, 3d supply, fill #0
  Filled 2021-01-22: qty 54, 27d supply, fill #0

## 2021-01-22 MED ORDER — ALLOPURINOL 100 MG PO TABS
100.0000 mg | ORAL_TABLET | Freq: Every day | ORAL | 1 refills | Status: DC
  Filled 2021-01-22: qty 90, 90d supply, fill #0
  Filled 2021-04-21: qty 90, 90d supply, fill #1

## 2021-01-22 MED ORDER — OXYCODONE HCL 20 MG PO TABS
20.0000 mg | ORAL_TABLET | Freq: Four times a day (QID) | ORAL | 0 refills | Status: AC | PRN
  Filled 2021-01-22: qty 120, 30d supply, fill #0

## 2021-01-23 ENCOUNTER — Other Ambulatory Visit (HOSPITAL_COMMUNITY): Payer: Self-pay

## 2021-01-24 DIAGNOSIS — Z79899 Other long term (current) drug therapy: Secondary | ICD-10-CM | POA: Diagnosis not present

## 2021-02-02 ENCOUNTER — Other Ambulatory Visit (HOSPITAL_COMMUNITY): Payer: Self-pay

## 2021-02-02 MED ORDER — SERTRALINE HCL 100 MG PO TABS
150.0000 mg | ORAL_TABLET | Freq: Every day | ORAL | 1 refills | Status: DC
  Filled 2021-02-02: qty 135, 90d supply, fill #0
  Filled 2021-06-19: qty 135, 90d supply, fill #1

## 2021-02-02 MED ORDER — LOSARTAN POTASSIUM 25 MG PO TABS
ORAL_TABLET | ORAL | 1 refills | Status: DC
  Filled 2021-02-02: qty 90, 90d supply, fill #0
  Filled 2021-05-11: qty 90, 90d supply, fill #1

## 2021-02-19 ENCOUNTER — Other Ambulatory Visit (HOSPITAL_COMMUNITY): Payer: Self-pay

## 2021-02-19 MED ORDER — PRAVASTATIN SODIUM 10 MG PO TABS
10.0000 mg | ORAL_TABLET | Freq: Every day | ORAL | 1 refills | Status: DC
  Filled 2021-02-19: qty 90, 90d supply, fill #0
  Filled 2021-05-21: qty 90, 90d supply, fill #1

## 2021-02-19 MED ORDER — ARIPIPRAZOLE 5 MG PO TABS
5.0000 mg | ORAL_TABLET | Freq: Every day | ORAL | 1 refills | Status: DC
  Filled 2021-02-19: qty 90, 90d supply, fill #0
  Filled 2021-05-16: qty 90, 90d supply, fill #1

## 2021-02-21 ENCOUNTER — Other Ambulatory Visit (HOSPITAL_COMMUNITY): Payer: Self-pay

## 2021-02-21 DIAGNOSIS — E119 Type 2 diabetes mellitus without complications: Secondary | ICD-10-CM | POA: Diagnosis not present

## 2021-02-21 DIAGNOSIS — Z79899 Other long term (current) drug therapy: Secondary | ICD-10-CM | POA: Diagnosis not present

## 2021-02-21 DIAGNOSIS — M5137 Other intervertebral disc degeneration, lumbosacral region: Secondary | ICD-10-CM | POA: Diagnosis not present

## 2021-02-21 DIAGNOSIS — F411 Generalized anxiety disorder: Secondary | ICD-10-CM | POA: Diagnosis not present

## 2021-02-21 MED ORDER — DIAZEPAM 2 MG PO TABS
ORAL_TABLET | ORAL | 0 refills | Status: DC
  Filled 2021-02-21: qty 60, 30d supply, fill #0

## 2021-02-21 MED ORDER — OXYCODONE HCL 20 MG PO TABS
ORAL_TABLET | ORAL | 0 refills | Status: DC
  Filled 2021-02-21: qty 120, 30d supply, fill #0

## 2021-02-23 DIAGNOSIS — Z79899 Other long term (current) drug therapy: Secondary | ICD-10-CM | POA: Diagnosis not present

## 2021-03-23 ENCOUNTER — Other Ambulatory Visit (HOSPITAL_COMMUNITY): Payer: Self-pay

## 2021-03-23 DIAGNOSIS — F419 Anxiety disorder, unspecified: Secondary | ICD-10-CM | POA: Diagnosis not present

## 2021-03-23 DIAGNOSIS — Z79899 Other long term (current) drug therapy: Secondary | ICD-10-CM | POA: Diagnosis not present

## 2021-03-23 DIAGNOSIS — M62838 Other muscle spasm: Secondary | ICD-10-CM | POA: Diagnosis not present

## 2021-03-23 DIAGNOSIS — R5383 Other fatigue: Secondary | ICD-10-CM | POA: Diagnosis not present

## 2021-03-23 DIAGNOSIS — M129 Arthropathy, unspecified: Secondary | ICD-10-CM | POA: Diagnosis not present

## 2021-03-23 DIAGNOSIS — E7800 Pure hypercholesterolemia, unspecified: Secondary | ICD-10-CM | POA: Diagnosis not present

## 2021-03-23 DIAGNOSIS — E559 Vitamin D deficiency, unspecified: Secondary | ICD-10-CM | POA: Diagnosis not present

## 2021-03-23 DIAGNOSIS — M545 Low back pain, unspecified: Secondary | ICD-10-CM | POA: Diagnosis not present

## 2021-03-23 MED ORDER — OXYCODONE HCL 20 MG PO TABS
ORAL_TABLET | ORAL | 0 refills | Status: DC
  Filled 2021-03-23: qty 122, 30d supply, fill #0

## 2021-03-23 MED ORDER — DIAZEPAM 2 MG PO TABS
ORAL_TABLET | ORAL | 0 refills | Status: DC
  Filled 2021-03-23: qty 60, 30d supply, fill #0

## 2021-03-24 ENCOUNTER — Other Ambulatory Visit (HOSPITAL_COMMUNITY): Payer: Self-pay

## 2021-03-24 MED ORDER — DIAZEPAM 2 MG PO TABS
2.0000 mg | ORAL_TABLET | Freq: Two times a day (BID) | ORAL | 0 refills | Status: DC | PRN
  Filled 2021-03-24 – 2021-03-26 (×2): qty 60, 30d supply, fill #0
  Filled 2021-04-21: qty 46, 23d supply, fill #0

## 2021-03-26 ENCOUNTER — Other Ambulatory Visit (HOSPITAL_COMMUNITY): Payer: Self-pay

## 2021-03-27 ENCOUNTER — Other Ambulatory Visit (HOSPITAL_COMMUNITY): Payer: Self-pay

## 2021-03-27 DIAGNOSIS — Z79899 Other long term (current) drug therapy: Secondary | ICD-10-CM | POA: Diagnosis not present

## 2021-03-31 ENCOUNTER — Other Ambulatory Visit (HOSPITAL_COMMUNITY): Payer: Self-pay

## 2021-04-19 ENCOUNTER — Other Ambulatory Visit (HOSPITAL_COMMUNITY): Payer: Self-pay

## 2021-04-19 MED ORDER — METFORMIN HCL 1000 MG PO TABS
1000.0000 mg | ORAL_TABLET | Freq: Two times a day (BID) | ORAL | 0 refills | Status: DC
  Filled 2021-04-19: qty 180, 90d supply, fill #0

## 2021-04-21 ENCOUNTER — Other Ambulatory Visit (HOSPITAL_COMMUNITY): Payer: Self-pay

## 2021-04-23 ENCOUNTER — Other Ambulatory Visit (HOSPITAL_COMMUNITY): Payer: Self-pay

## 2021-04-23 DIAGNOSIS — M62838 Other muscle spasm: Secondary | ICD-10-CM | POA: Diagnosis not present

## 2021-04-23 DIAGNOSIS — F419 Anxiety disorder, unspecified: Secondary | ICD-10-CM | POA: Diagnosis not present

## 2021-04-23 DIAGNOSIS — Z125 Encounter for screening for malignant neoplasm of prostate: Secondary | ICD-10-CM | POA: Diagnosis not present

## 2021-04-23 DIAGNOSIS — M545 Low back pain, unspecified: Secondary | ICD-10-CM | POA: Diagnosis not present

## 2021-04-23 DIAGNOSIS — Z79899 Other long term (current) drug therapy: Secondary | ICD-10-CM | POA: Diagnosis not present

## 2021-04-23 DIAGNOSIS — E119 Type 2 diabetes mellitus without complications: Secondary | ICD-10-CM | POA: Diagnosis not present

## 2021-04-23 MED ORDER — DIAZEPAM 5 MG PO TABS
ORAL_TABLET | ORAL | 0 refills | Status: DC
  Filled 2021-04-23: qty 60, 30d supply, fill #0

## 2021-04-23 MED ORDER — OXYCODONE HCL 20 MG PO TABS
ORAL_TABLET | ORAL | 0 refills | Status: DC
  Filled 2021-04-23: qty 120, 30d supply, fill #0

## 2021-04-25 DIAGNOSIS — Z79899 Other long term (current) drug therapy: Secondary | ICD-10-CM | POA: Diagnosis not present

## 2021-05-11 ENCOUNTER — Other Ambulatory Visit (HOSPITAL_COMMUNITY): Payer: Self-pay

## 2021-05-17 ENCOUNTER — Other Ambulatory Visit (HOSPITAL_COMMUNITY): Payer: Self-pay

## 2021-05-22 ENCOUNTER — Other Ambulatory Visit (HOSPITAL_COMMUNITY): Payer: Self-pay

## 2021-05-22 DIAGNOSIS — M545 Low back pain, unspecified: Secondary | ICD-10-CM | POA: Diagnosis not present

## 2021-05-22 DIAGNOSIS — R030 Elevated blood-pressure reading, without diagnosis of hypertension: Secondary | ICD-10-CM | POA: Diagnosis not present

## 2021-05-22 DIAGNOSIS — E119 Type 2 diabetes mellitus without complications: Secondary | ICD-10-CM | POA: Diagnosis not present

## 2021-05-22 DIAGNOSIS — Z79899 Other long term (current) drug therapy: Secondary | ICD-10-CM | POA: Diagnosis not present

## 2021-05-22 DIAGNOSIS — F419 Anxiety disorder, unspecified: Secondary | ICD-10-CM | POA: Diagnosis not present

## 2021-05-22 MED ORDER — DIAZEPAM 5 MG PO TABS
ORAL_TABLET | ORAL | 0 refills | Status: DC
  Filled 2021-05-22: qty 60, 30d supply, fill #0

## 2021-05-22 MED ORDER — OXYCODONE HCL 20 MG PO TABS
ORAL_TABLET | ORAL | 0 refills | Status: DC
  Filled 2021-05-22: qty 116, 29d supply, fill #0

## 2021-05-28 DIAGNOSIS — Z79899 Other long term (current) drug therapy: Secondary | ICD-10-CM | POA: Diagnosis not present

## 2021-06-06 IMAGING — CT CT ABD-PELV W/ CM
2 of 5 series · 16 of 46 positions shown, 18 images · IV contrast (Omnipaque)
Comparison: None.

CLINICAL DATA: Abdominal pain, right upper quadrant pain, vomiting

EXAM:
CT ABDOMEN AND PELVIS WITH CONTRAST
TECHNIQUE: Multidetector CT imaging of the abdomen and pelvis was performed
using the standard protocol following bolus administration of
intravenous contrast.
CONTRAST:  100mL OMNIPAQUE IOHEXOL 300 MG/ML  SOLN

[Series 2: axial st · axial · 0.94mm/px · z∈[-886,-446]mm · 13 of 100 slices shown, 15 images]
[im 6/100  soft-tissue]
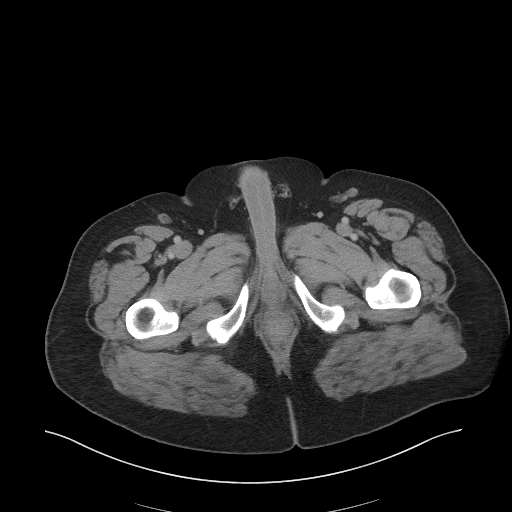
[im 6/100  bone]
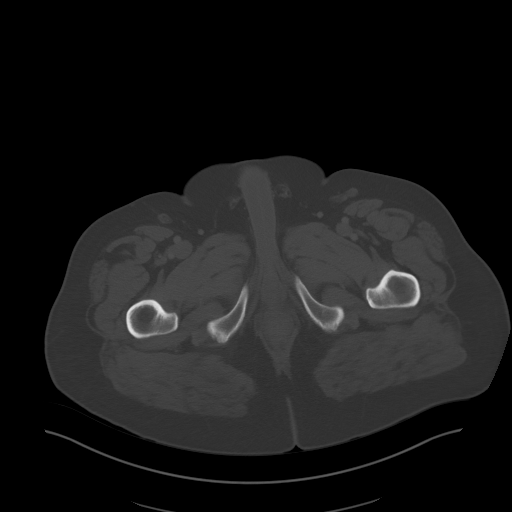
[im 12/100  soft-tissue]
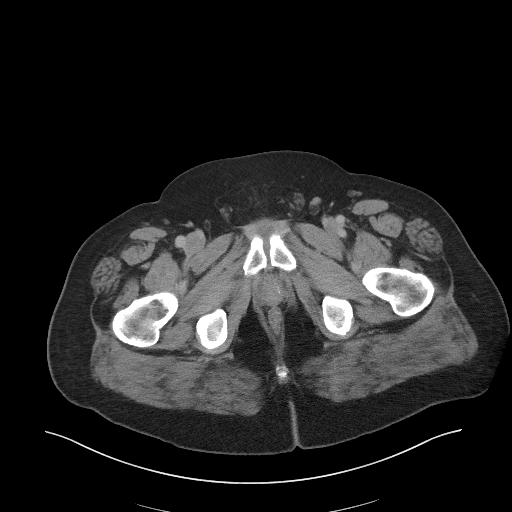
[im 24/100  soft-tissue]
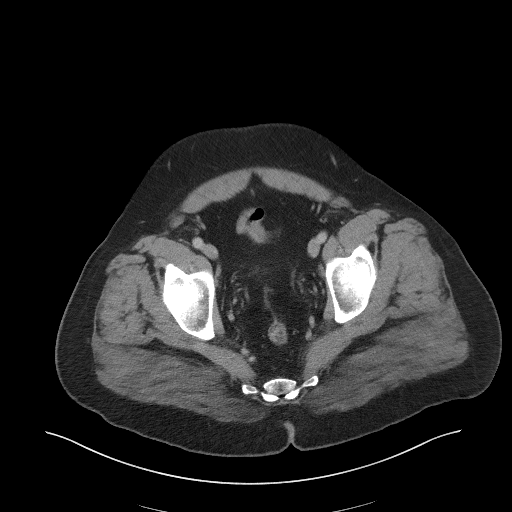
[im 30/100  soft-tissue]
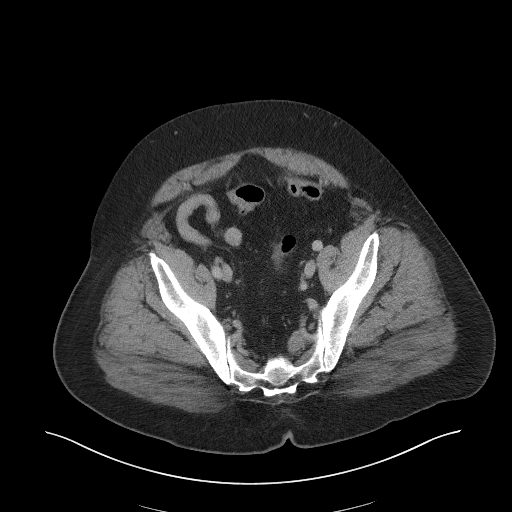
[im 35/100  soft-tissue]
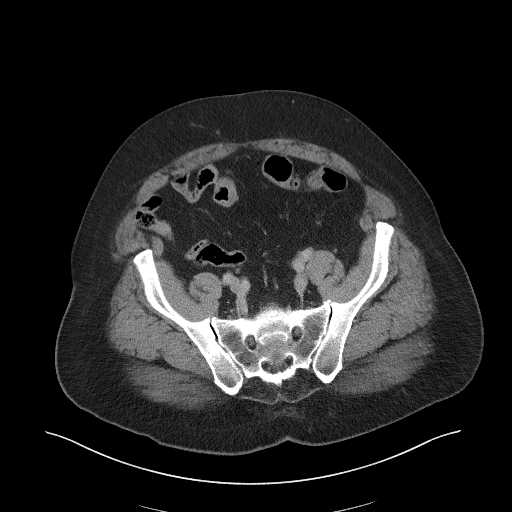
[im 41/100  soft-tissue]
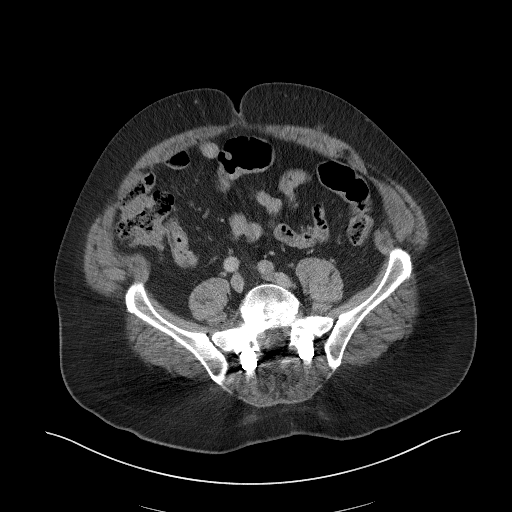
[im 53/100  soft-tissue]
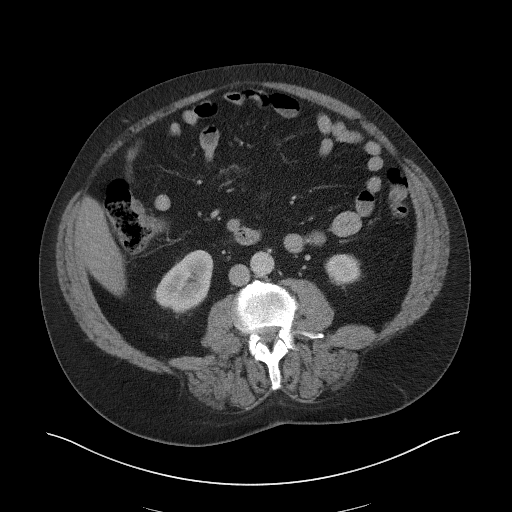
[im 59/100  soft-tissue]
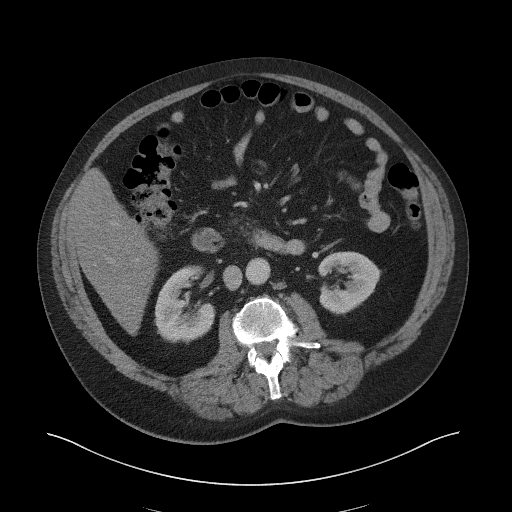
[im 65/100  soft-tissue]
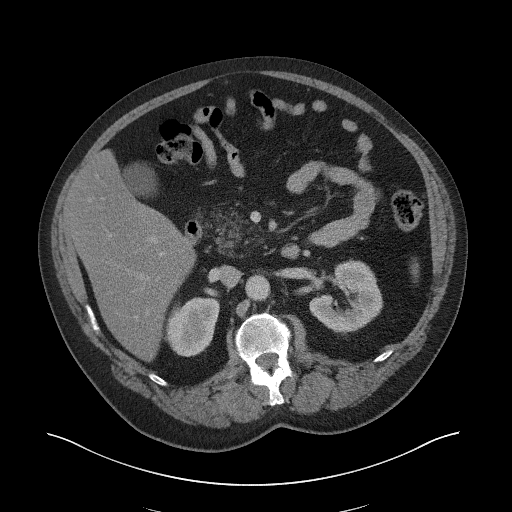
[im 65/100  bone]
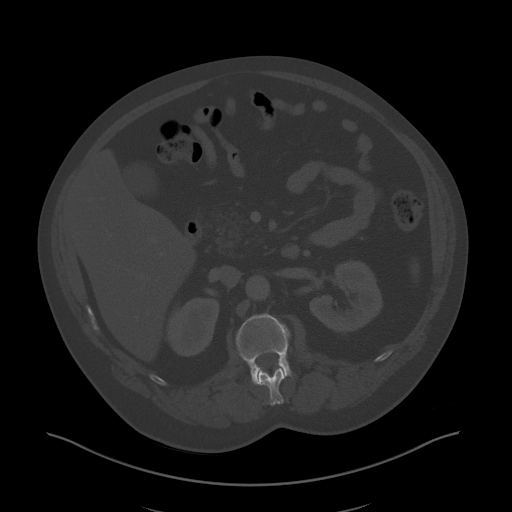
[im 70/100  soft-tissue]
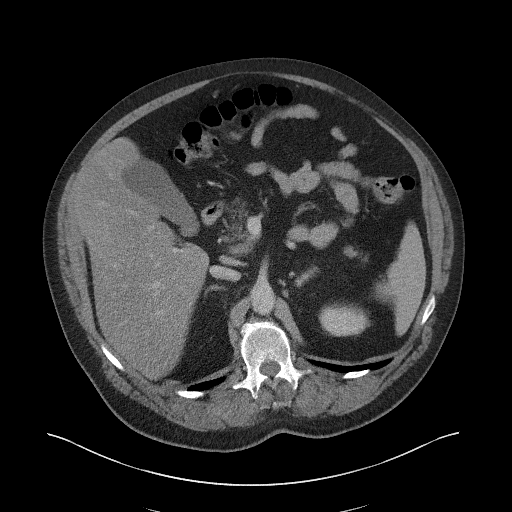
[im 76/100  soft-tissue]
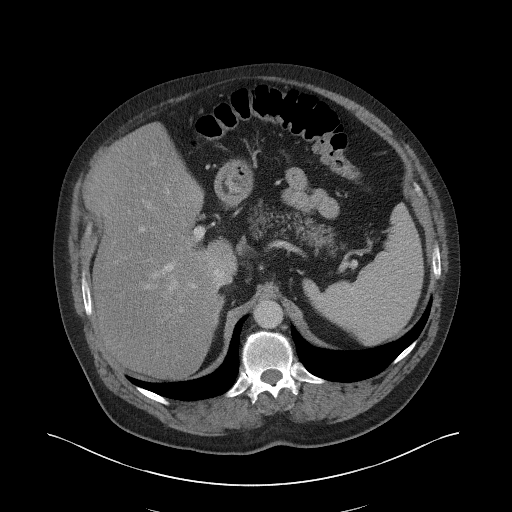
[im 88/100  soft-tissue]
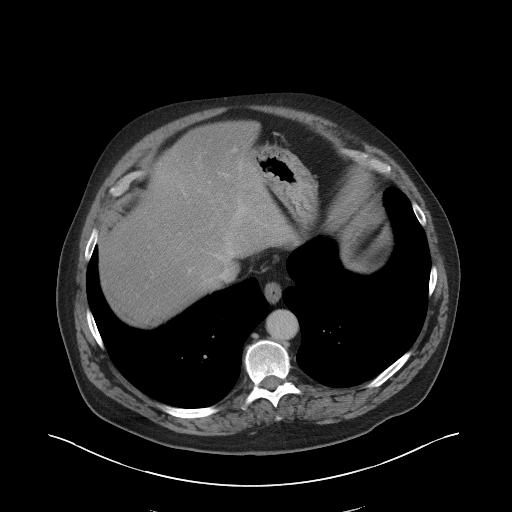
[im 94/100  soft-tissue]
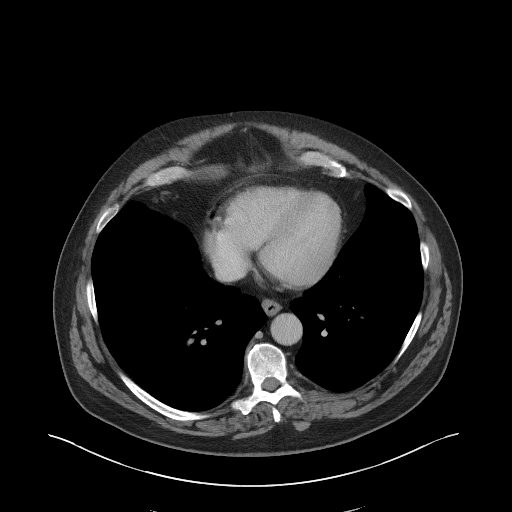

[Series 5: coronal st · coronal · 0.82mm/px · 3 of 131 slices shown]
[im 44/131  soft-tissue]
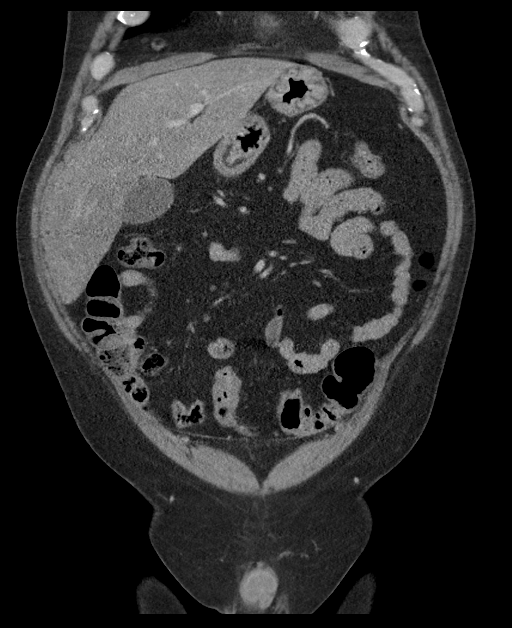
[im 58/131  soft-tissue]
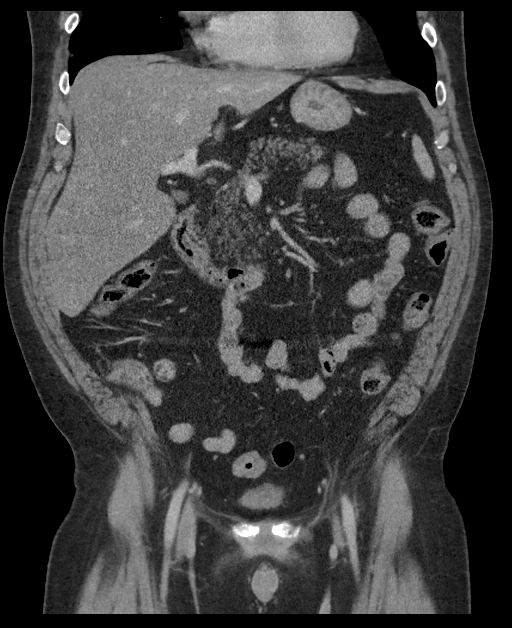
[im 73/131  soft-tissue]
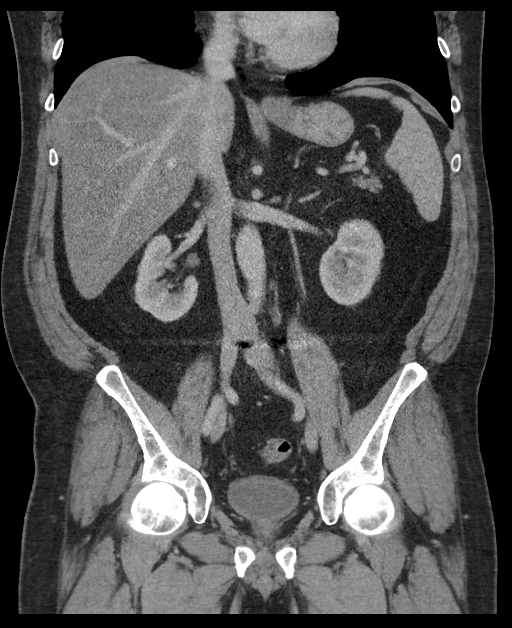

[16 of 46 positions shown; findings below may reference images not displayed]

FINDINGS: Lower chest: 4 mm nodule in the left lower lobe on image 7. Lung
bases otherwise clear. No effusions.

Hepatobiliary: Diffuse low-density throughout the liver compatible
with fatty infiltration. No focal abnormality. Gallbladder
unremarkable.

Pancreas: No focal abnormality or ductal dilatation.

Spleen: No focal abnormality.  Normal size.

Adrenals/Urinary Tract: No adrenal abnormality. No focal renal
abnormality. No stones or hydronephrosis. Urinary bladder is
unremarkable.

Stomach/Bowel: Stomach, large and small bowel grossly unremarkable.

Vascular/Lymphatic: No evidence of aneurysm or adenopathy.

Reproductive: No visible focal abnormality.

Other: No free fluid or free air.

Musculoskeletal: No acute bony abnormality. Postoperative changes in
the lower lumbar spine.
IMPRESSION: Diffuse fatty infiltration of the liver.

No acute findings in the abdomen or pelvis.

## 2021-06-14 DIAGNOSIS — Z23 Encounter for immunization: Secondary | ICD-10-CM | POA: Diagnosis not present

## 2021-06-14 DIAGNOSIS — M109 Gout, unspecified: Secondary | ICD-10-CM | POA: Diagnosis not present

## 2021-06-14 DIAGNOSIS — K219 Gastro-esophageal reflux disease without esophagitis: Secondary | ICD-10-CM | POA: Diagnosis not present

## 2021-06-14 DIAGNOSIS — G8929 Other chronic pain: Secondary | ICD-10-CM | POA: Diagnosis not present

## 2021-06-14 DIAGNOSIS — F419 Anxiety disorder, unspecified: Secondary | ICD-10-CM | POA: Diagnosis not present

## 2021-06-14 DIAGNOSIS — J309 Allergic rhinitis, unspecified: Secondary | ICD-10-CM | POA: Diagnosis not present

## 2021-06-14 DIAGNOSIS — E1169 Type 2 diabetes mellitus with other specified complication: Secondary | ICD-10-CM | POA: Diagnosis not present

## 2021-06-14 DIAGNOSIS — F339 Major depressive disorder, recurrent, unspecified: Secondary | ICD-10-CM | POA: Diagnosis not present

## 2021-06-14 DIAGNOSIS — E7800 Pure hypercholesterolemia, unspecified: Secondary | ICD-10-CM | POA: Diagnosis not present

## 2021-06-19 ENCOUNTER — Other Ambulatory Visit (HOSPITAL_COMMUNITY): Payer: Self-pay

## 2021-06-22 ENCOUNTER — Other Ambulatory Visit (HOSPITAL_COMMUNITY): Payer: Self-pay

## 2021-06-22 DIAGNOSIS — M545 Low back pain, unspecified: Secondary | ICD-10-CM | POA: Diagnosis not present

## 2021-06-22 DIAGNOSIS — Z79899 Other long term (current) drug therapy: Secondary | ICD-10-CM | POA: Diagnosis not present

## 2021-06-22 DIAGNOSIS — Z6830 Body mass index (BMI) 30.0-30.9, adult: Secondary | ICD-10-CM | POA: Diagnosis not present

## 2021-06-22 DIAGNOSIS — E119 Type 2 diabetes mellitus without complications: Secondary | ICD-10-CM | POA: Diagnosis not present

## 2021-06-22 DIAGNOSIS — F419 Anxiety disorder, unspecified: Secondary | ICD-10-CM | POA: Diagnosis not present

## 2021-06-22 MED ORDER — OXYCODONE HCL 20 MG PO TABS
ORAL_TABLET | ORAL | 0 refills | Status: DC
  Filled 2021-06-22: qty 120, 30d supply, fill #0

## 2021-06-22 MED ORDER — DIAZEPAM 5 MG PO TABS
ORAL_TABLET | ORAL | 0 refills | Status: DC
  Filled 2021-06-22: qty 60, 30d supply, fill #0

## 2021-06-22 MED ORDER — BUPRENORPHINE HCL 8 MG SL SUBL
SUBLINGUAL_TABLET | SUBLINGUAL | 0 refills | Status: DC
  Filled 2021-06-22: qty 60, 30d supply, fill #0

## 2021-06-25 DIAGNOSIS — Z79899 Other long term (current) drug therapy: Secondary | ICD-10-CM | POA: Diagnosis not present

## 2021-07-20 ENCOUNTER — Other Ambulatory Visit (HOSPITAL_COMMUNITY): Payer: Self-pay

## 2021-07-20 DIAGNOSIS — F419 Anxiety disorder, unspecified: Secondary | ICD-10-CM | POA: Diagnosis not present

## 2021-07-20 DIAGNOSIS — M545 Low back pain, unspecified: Secondary | ICD-10-CM | POA: Diagnosis not present

## 2021-07-20 DIAGNOSIS — Z79899 Other long term (current) drug therapy: Secondary | ICD-10-CM | POA: Diagnosis not present

## 2021-07-20 DIAGNOSIS — E119 Type 2 diabetes mellitus without complications: Secondary | ICD-10-CM | POA: Diagnosis not present

## 2021-07-20 DIAGNOSIS — R030 Elevated blood-pressure reading, without diagnosis of hypertension: Secondary | ICD-10-CM | POA: Diagnosis not present

## 2021-07-20 MED ORDER — NALOXONE HCL 4 MG/0.1ML NA LIQD
NASAL | 1 refills | Status: AC
  Filled 2021-07-20: qty 2, 1d supply, fill #0

## 2021-07-21 ENCOUNTER — Other Ambulatory Visit (HOSPITAL_COMMUNITY): Payer: Self-pay

## 2021-07-21 MED ORDER — BUPRENORPHINE HCL 8 MG SL SUBL
SUBLINGUAL_TABLET | SUBLINGUAL | 0 refills | Status: DC
  Filled 2021-07-21: qty 60, 30d supply, fill #0

## 2021-07-21 MED ORDER — OXYCODONE HCL 20 MG PO TABS
ORAL_TABLET | ORAL | 0 refills | Status: AC
  Filled 2021-07-21: qty 120, 30d supply, fill #0

## 2021-07-23 ENCOUNTER — Other Ambulatory Visit (HOSPITAL_COMMUNITY): Payer: Self-pay

## 2021-07-23 MED ORDER — ALLOPURINOL 100 MG PO TABS
100.0000 mg | ORAL_TABLET | Freq: Every day | ORAL | 1 refills | Status: DC
  Filled 2021-07-23: qty 90, 90d supply, fill #0
  Filled 2021-10-23: qty 90, 90d supply, fill #1

## 2021-07-23 MED ORDER — DIAZEPAM 5 MG PO TABS
ORAL_TABLET | ORAL | 0 refills | Status: AC
  Filled 2021-07-23: qty 60, 30d supply, fill #0

## 2021-07-25 DIAGNOSIS — Z79899 Other long term (current) drug therapy: Secondary | ICD-10-CM | POA: Diagnosis not present

## 2021-08-02 ENCOUNTER — Other Ambulatory Visit (HOSPITAL_COMMUNITY): Payer: Self-pay

## 2021-08-02 MED ORDER — METFORMIN HCL 1000 MG PO TABS
1000.0000 mg | ORAL_TABLET | Freq: Two times a day (BID) | ORAL | 0 refills | Status: DC
  Filled 2021-08-02: qty 180, 90d supply, fill #0

## 2021-08-08 ENCOUNTER — Other Ambulatory Visit (HOSPITAL_COMMUNITY): Payer: Self-pay

## 2021-08-08 MED ORDER — LOSARTAN POTASSIUM 25 MG PO TABS
ORAL_TABLET | ORAL | 1 refills | Status: DC
  Filled 2021-08-08: qty 90, 90d supply, fill #0
  Filled 2021-11-08: qty 90, 90d supply, fill #1

## 2021-08-17 ENCOUNTER — Other Ambulatory Visit (HOSPITAL_COMMUNITY): Payer: Self-pay

## 2021-08-17 MED ORDER — DIAZEPAM 5 MG PO TABS
5.0000 mg | ORAL_TABLET | Freq: Three times a day (TID) | ORAL | 0 refills | Status: DC | PRN
  Filled 2021-08-18: qty 60, 30d supply, fill #0

## 2021-08-17 MED ORDER — ARIPIPRAZOLE 5 MG PO TABS
5.0000 mg | ORAL_TABLET | Freq: Every day | ORAL | 1 refills | Status: DC
  Filled 2021-08-17: qty 90, 90d supply, fill #0
  Filled 2021-11-12: qty 90, 90d supply, fill #1

## 2021-08-17 MED ORDER — BUPRENORPHINE HCL 8 MG SL SUBL
SUBLINGUAL_TABLET | SUBLINGUAL | 0 refills | Status: DC
  Filled 2021-08-18: qty 60, 30d supply, fill #0
  Filled 2021-08-18: qty 60, fill #0

## 2021-08-17 MED ORDER — OXYCODONE HCL 20 MG PO TABS
20.0000 mg | ORAL_TABLET | Freq: Four times a day (QID) | ORAL | 0 refills | Status: AC | PRN
  Filled 2021-08-18: qty 112, 28d supply, fill #0

## 2021-08-17 MED ORDER — PRAVASTATIN SODIUM 10 MG PO TABS
10.0000 mg | ORAL_TABLET | Freq: Every day | ORAL | 1 refills | Status: DC
  Filled 2021-08-17: qty 90, 90d supply, fill #0
  Filled 2021-11-12: qty 90, 90d supply, fill #1

## 2021-08-18 ENCOUNTER — Other Ambulatory Visit (HOSPITAL_COMMUNITY): Payer: Self-pay

## 2021-08-18 MED ORDER — NALOXONE HCL 4 MG/0.1ML NA LIQD: NASAL | 1 refills | Status: AC

## 2021-08-21 ENCOUNTER — Other Ambulatory Visit (HOSPITAL_COMMUNITY): Payer: Self-pay

## 2021-08-21 MED ORDER — CEPHALEXIN 500 MG PO CAPS
ORAL_CAPSULE | ORAL | 0 refills | Status: DC
  Filled 2021-08-21: qty 20, 5d supply, fill #0

## 2021-09-03 ENCOUNTER — Other Ambulatory Visit (HOSPITAL_COMMUNITY): Payer: Self-pay

## 2021-09-14 ENCOUNTER — Other Ambulatory Visit (HOSPITAL_COMMUNITY): Payer: Self-pay

## 2021-09-15 ENCOUNTER — Other Ambulatory Visit (HOSPITAL_COMMUNITY): Payer: Self-pay

## 2021-09-15 MED ORDER — OXYCODONE HCL 20 MG PO TABS
20.0000 mg | ORAL_TABLET | Freq: Four times a day (QID) | ORAL | 0 refills | Status: AC | PRN
  Filled 2021-09-15: qty 112, 28d supply, fill #0

## 2021-09-15 MED ORDER — DIAZEPAM 5 MG PO TABS
5.0000 mg | ORAL_TABLET | Freq: Two times a day (BID) | ORAL | 0 refills | Status: DC | PRN
  Filled 2021-09-15 – 2021-09-17 (×2): qty 60, 30d supply, fill #0

## 2021-09-15 MED ORDER — BUPRENORPHINE HCL 8 MG SL SUBL
SUBLINGUAL_TABLET | SUBLINGUAL | 0 refills | Status: DC
  Filled 2021-09-15: qty 60, 30d supply, fill #0
  Filled 2021-09-17: qty 15, 7d supply, fill #0
  Filled 2021-09-24: qty 45, 23d supply, fill #1

## 2021-09-17 ENCOUNTER — Ambulatory Visit: Payer: BC Managed Care – PPO | Admitting: Gastroenterology

## 2021-09-17 ENCOUNTER — Encounter: Payer: Self-pay | Admitting: Gastroenterology

## 2021-09-17 ENCOUNTER — Other Ambulatory Visit (INDEPENDENT_AMBULATORY_CARE_PROVIDER_SITE_OTHER): Payer: BC Managed Care – PPO

## 2021-09-17 ENCOUNTER — Other Ambulatory Visit (HOSPITAL_COMMUNITY): Payer: Self-pay

## 2021-09-17 VITALS — BP 102/74 | HR 68 | Ht 69.0 in | Wt 198.0 lb

## 2021-09-17 DIAGNOSIS — K219 Gastro-esophageal reflux disease without esophagitis: Secondary | ICD-10-CM | POA: Diagnosis not present

## 2021-09-17 DIAGNOSIS — K649 Unspecified hemorrhoids: Secondary | ICD-10-CM

## 2021-09-17 DIAGNOSIS — K625 Hemorrhage of anus and rectum: Secondary | ICD-10-CM

## 2021-09-17 DIAGNOSIS — R194 Change in bowel habit: Secondary | ICD-10-CM

## 2021-09-17 DIAGNOSIS — R1011 Right upper quadrant pain: Secondary | ICD-10-CM

## 2021-09-17 LAB — CBC WITH DIFFERENTIAL/PLATELET
Basophils Absolute: 0 10*3/uL (ref 0.0–0.1)
Basophils Relative: 0.3 % (ref 0.0–3.0)
Eosinophils Absolute: 0.1 10*3/uL (ref 0.0–0.7)
Eosinophils Relative: 1.2 % (ref 0.0–5.0)
HCT: 39.6 % (ref 39.0–52.0)
Hemoglobin: 13.4 g/dL (ref 13.0–17.0)
Lymphocytes Relative: 10.9 % — ABNORMAL LOW (ref 12.0–46.0)
Lymphs Abs: 1 10*3/uL (ref 0.7–4.0)
MCHC: 33.9 g/dL (ref 30.0–36.0)
MCV: 87.2 fl (ref 78.0–100.0)
Monocytes Absolute: 0.5 10*3/uL (ref 0.1–1.0)
Monocytes Relative: 5.4 % (ref 3.0–12.0)
Neutro Abs: 7.7 10*3/uL (ref 1.4–7.7)
Neutrophils Relative %: 82.2 % — ABNORMAL HIGH (ref 43.0–77.0)
Platelets: 198 10*3/uL (ref 150.0–400.0)
RBC: 4.54 Mil/uL (ref 4.22–5.81)
RDW: 14.2 % (ref 11.5–15.5)
WBC: 9.4 10*3/uL (ref 4.0–10.5)

## 2021-09-17 LAB — HEPATIC FUNCTION PANEL
ALT: 23 U/L (ref 0–53)
AST: 17 U/L (ref 0–37)
Albumin: 4.7 g/dL (ref 3.5–5.2)
Alkaline Phosphatase: 62 U/L (ref 39–117)
Bilirubin, Direct: 0.1 mg/dL (ref 0.0–0.3)
Total Bilirubin: 0.3 mg/dL (ref 0.2–1.2)
Total Protein: 7.4 g/dL (ref 6.0–8.3)

## 2021-09-17 NOTE — Progress Notes (Signed)
HPI :  56 year old male who I know from history of colon polyps and colonoscopy in March 2022, here for reassessment of abdominal pain, rectal bleeding, altered bowel habits.  He reports having some upper abdominal discomfort ongoing for the past 3 months or so.  He localizes this to the right upper quadrant, can also radiate to the epigastric area.  Its been occurring perhaps once per week or so, can last about 30 minutes to 3 hours at a time.  Denies any nausea or vomiting with this.  He does think that eating can stimulate or trigger his pain, however has a hard time clarifying which particular foods actually do this.  Pain does not radiate to the shoulder or back.  He does have some reflux at baseline, has had this for years.  Uses over-the-counter omeprazole usually 4 to 5 days a week longstanding which typically controls his symptoms.  He denies any dysphagia.  He still has his gallbladder.  Denies any NSAID use.  Since being on allopurinol for gout he denies any flares of that or need to use NSAIDs.  His sister has achalasia but no family history of esophageal cancer.  No family history of gastric cancer.  He had a CT scan of his abdomen pelvis 2021 which showed some fatty liver but no concerning pathology otherwise.  He does endorse taking oxycodone 20 mg twice daily for chronic back and joint pain.  He states for the past 6 months his bowels have been a bit erratic.  Some days he will have loose stools, other days he will have constipated stools and will not move his bowels.  About 3 months ago he has noticed some scant bright red blood per rectum at times.  He has had this about 3 or 4 times total since that time.  He has occasional rectal pain with this.  Has known history of hemorrhoids that bother him periodically.  His brother had colon cancer at age 74, his mother had colon cancer diagnosed around the same age.  He had a colonoscopy with me last year in March, double prep used, he had 9  polyps removed, largest 15 mm, most adenomas.  3-year follow-up exam was recommended.  Of note I inquired him about his seizure history.  He states he had a one-time seizure a long time ago, does not have chronic seizure disorder, does not take any medications for this.  Colonoscopy 05/19/20: - double prep used Colonic spasm. - Two 3 mm polyps in the cecum, removed with a cold snare. Resected and retrieved. - Two 5 to 6 mm polyps in the ascending colon, removed with a cold snare. Resected and retrieved. - Three 4 to 6 mm polyps in the transverse colon, removed with a cold snare. Resected and retrieved. - One 15 mm polyp in the transverse colon, removed with a hot snare. Resected and retrieved. - One 5 mm polyp in the sigmoid colon, removed with a cold snare. Resected and retrieved. - Suspected benign hyperplastic polyps in the left colon, three representative polyps removed with a cold snare. Resected and retrieved. - Diverticulosis in the sigmoid colon. - Internal hemorrhoids. - The examination was otherwise normal.  1. Surgical [P], colon, sigmoid, transverse, ascending, cecal, polyp (8) - TUBULAR ADENOMA(S) WITHOUT HIGH-GRADE DYSPLASIA OR MALIGNANCY - SESSILE SERRATED POLYP(S) WITHOUT CYTOLOGIC DYSPLASIA 2. Surgical [P], colon, transverse, polyp (1) - TUBULAR ADENOMA(S) WITHOUT HIGH-GRADE DYSPLASIA OR MALIGNANCY 3. Surgical [P], colon, rectum, recto-sigmoid, polyp (4) - HYPERPLASTIC POLYP(S)  Repeat in 3 years   Colonoscopy 12/05/2016 - Dr. Virgel Bouquet - numerous polyps removed - "poor prep"    Past Medical History:  Diagnosis Date   Allergy    Anxiety    Arthritis    Asthma    Back pain    Cancer ([place])    skin cancer    Depression    Diabetes mellitus without complication (HCC)    GERD (gastroesophageal reflux disease)    Gout    Hyperlipidemia    Hypertension    Mitral valve prolapse    Seizures (HCC)      Past Surgical History:  Procedure Laterality Date    BACK SURGERY     x2   COLONOSCOPY     2018   KNEE SURGERY     POLYPECTOMY     SHOULDER SURGERY Right    x2   Family History  Problem Relation Age of Onset   Colon cancer Mother        in her 88's   Colon cancer Brother        in his 69's- he was a 9/11 [employer]- 9/11 relayed colon cancer    Colon cancer Maternal Grandfather    Leukemia Sister    Colon polyps Neg Hx    Esophageal cancer Neg Hx    Rectal cancer Neg Hx    Stomach cancer Neg Hx    Social History   Tobacco Use   Smoking status: Never   Smokeless tobacco: Current    Types: Snuff  Vaping Use   Vaping Use: Never used  Substance Use Topics   Alcohol use: Not Currently   Drug use: No   Current Outpatient Medications  Medication Sig Dispense Refill   albuterol (VENTOLIN HFA) 108 (90 Base) MCG/ACT inhaler Inhale 1-2 puffs into the lungs every 6 (six) hours as needed for wheezing or shortness of breath.     allopurinol (ZYLOPRIM) 100 MG tablet Take 100 mg by mouth daily.     allopurinol (ZYLOPRIM) 100 MG tablet TAKE 1 TABLET BY MOUTH DAILY 30 tablet 1   allopurinol (ZYLOPRIM) 100 MG tablet TAKE 1 TABLET BY MOUTH ONCE DAILY 30 tablet 2   allopurinol (ZYLOPRIM) 100 MG tablet Take 1 tablet by mouth daily. 90 tablet 1   ARIPiprazole (ABILIFY) 5 MG tablet Take 5 mg by mouth daily.     ARIPiprazole (ABILIFY) 5 MG tablet TAKE 1 TABLET BY MOUTH ONCE DAILY 90 tablet 1   ARIPiprazole (ABILIFY) 5 MG tablet TAKE 1 TABLET BY MOUTH ONCE A DAY 90 tablet 1   buprenorphine (SUBUTEX) 8 MG SUBL SL tablet Dissolve 1 tablet under tongue two times daily 60 tablet 0   cephALEXin (KEFLEX) 500 MG capsule Take 2 capsules by mouth 2 times a day 20 capsule 0   diazepam (VALIUM) 2 MG tablet Take 1 tablet by mouth two times daily as needed. 60 tablet 0   diazepam (VALIUM) 2 MG tablet Take 1 tablet (2 mg total) by mouth 2 (two) times daily as needed. 60 tablet 0   diazepam (VALIUM) 2 MG tablet Take 1 tablet by mouth two times daily as needed 60  tablet 0   diazepam (VALIUM) 2 MG tablet Take 1 tablet (2 mg total) by mouth 2 (two) times daily as needed. 60 tablet 0   diazepam (VALIUM) 5 MG tablet Take 1 (one) tablet by mouth two times daily as needed 60 tablet 0   diazepam (VALIUM) 5 MG tablet Take 1 tablet (  5 mg total) by mouth two times daily as needed. 60 tablet 0   diazepam (VALIUM) 5 MG tablet Take 1 tablet (5 mg total) by mouth 2 (two) times daily as needed. 60 tablet 0   fluticasone (FLONASE) 50 MCG/ACT nasal spray Place 2 sprays into both nostrils daily.     fluticasone-salmeterol (ADVAIR) 100-50 MCG/ACT AEPB Inhale 1 puff by mouth into the lungs twice a day 180 each 3   Fluticasone-Salmeterol (ADVAIR) 100-50 MCG/DOSE AEPB 1 puff     indomethacin (INDOCIN) 50 MG capsule Take 1 capsule (50 mg total) by mouth 3 (three) times daily with food as needed for gout 20 capsule 0   losartan (COZAAR) 25 MG tablet Take 25 mg by mouth daily.     losartan (COZAAR) 25 MG tablet TAKE 1 TABLET BY MOUTH ONCE DAILY FOR KIDNEY PROTECTION 90 tablet 1   metFORMIN (GLUCOPHAGE) 1000 MG tablet TAKE 1 TABLET BY MOUTH TWICE DAILY WITH A MEAL 180 tablet 0   metFORMIN (GLUCOPHAGE) 500 MG tablet Take 500 mg by mouth 2 (two) times daily with a meal.     Multiple Vitamins-Minerals (MULTIVITAMINS THER. W/MINERALS) TABS Take 1 tablet by mouth daily.     naloxone (NARCAN) nasal spray 4 mg/0.1 mL Place 1 spray into the nostil  as needed, IF FOUND UNRESPONSIVE THEN SPRAY INTO NOSE AND CALL 911 IMMEDIATELY 2 each 1   naloxone (NARCAN) nasal spray 4 mg/0.1 mL 1 (one) spray, non-aerosol as needed, IF FOUND UNRESPONSIVE THEN SPRAY INTO NOSE AND CALL 911 IMMEDIATELY 1 each 1   naproxen sodium (ANAPROX) 220 MG tablet Take 220 mg by mouth daily as needed (for pain).  (Patient not taking: Reported on 05/05/2020)     omeprazole (PRILOSEC OTC) 20 MG tablet Take 20 mg by mouth daily.     oxyCODONE (OXYCONTIN) 10 mg 12 hr tablet Take 1 tablet by mouth 2 times a day Fill on 09/13/20  60 tablet 0   oxyCODONE (ROXICODONE) 15 MG immediate release tablet Take 15 mg by mouth 5 (five) times daily as needed.     Oxycodone HCl 20 MG TABS Take 1 tablet by mouth four times daily as needed. 120 tablet 0   Oxycodone HCl 20 MG TABS Take 1 tablet (20 mg total) by mouth 4 (four) times daily as needed. 120 tablet 0   Oxycodone HCl 20 MG TABS Take 1 (one) Tablet by mouth four times daily, as needed 120 tablet 0   Oxycodone HCl 20 MG TABS Take 1 tablet (20 mg total) by mouth 4 (four) times daily as needed. 112 tablet 0   Oxycodone HCl 20 MG TABS Take 1 tablet (20 mg total) by mouth 4 (four) times daily as needed. 112 tablet 0   pravastatin (PRAVACHOL) 10 MG tablet Take 10 mg by mouth at bedtime.     pravastatin (PRAVACHOL) 10 MG tablet TAKE 1 TABLET BY MOUTH ONCE DAILY 90 tablet 1   pravastatin (PRAVACHOL) 10 MG tablet TAKE 1 TABLET BY MOUTH ONCE A DAY 90 tablet 1   sertraline (ZOLOFT) 100 MG tablet Take 100 mg by mouth daily.     sertraline (ZOLOFT) 100 MG tablet TAKE 1 AND 1/2 TABLETS BY MOUTH ONCE DAILY 145 tablet 1   sertraline (ZOLOFT) 100 MG tablet TAKE 1 AND 1/2 TABLETS BY MOUTH DAILY 135 tablet 1   No current facility-administered medications for this visit.   Allergies  Allergen Reactions   Penicillins Hives    hives   Sulfa Antibiotics  Nausea And Vomiting and Swelling    "sick"     Review of Systems: All systems reviewed and negative except where noted in HPI.      Physical Exam: BP 102/74   Pulse 68   Ht '5\' 9"'$  (1.753 m)   Wt 198 lb (89.8 kg)   SpO2 97%   BMI 29.24 kg/m  Constitutional: Pleasant,well-developed, male in no acute distress. Abdominal: Soft, nondistended, nontender. There are no masses palpable. DRE / Anoscopy - [facility] as standby - no fissure, internal hemorrhoids noted with some inflammatory changes, no mass lesions or polypoid lesions Extremities: no edema Neurological: Alert and oriented to person place and time. Psychiatric: Normal mood  and affect. Behavior is normal.   ASSESSMENT AND PLAN: 56 year old male here for reassessment of the following:  Right upper quadrant pain GERD Altered bowel habits Rectal bleeding Hemorrhoids  As above, intermittent right upper quadrant epigastric pain ongoing for the past 3 months or so, seems to have some postprandial component but on chronic PPI without NSAID use.  Concern initially was for biliary colic.  Recommending right upper quadrant ultrasound to screen for gallstones.  Reviewed his prior CT scan with him which did not show any concerning findings otherwise other than some fatty liver.  We otherwise discussed his history of reflux, given chronicity of this, his age, ethnicity, he meets criteria for screening EGD to evaluate for Barrett's esophagus.  This would also clear his stomach for pathology to cause his symptoms.  I am recommending an EGD in this light.  We discussed what EGD entailed, risk benefits, he is never had 1 of these before, he wants to proceed with that as well.  Further recommendations pending the results of the EGD and right upper quadrant ultrasound.  He will continue present dosing of omeprazole.  I will check LFTs to make sure stable.  Otherwise he does take some chronic narcotics which can lead to bowel dysfunction although this seems relatively recent for him.  His colonoscopy is up-to-date, I suspect hemorrhoidal bleeding is the cause of his symptoms in the setting of bowel dysfunction.  We will check CBC to make sure hemoglobin is stable.  Recommending Citrucel once daily to provide some regularity with his bowels, he can use Calmol 4 as needed as well.  If symptoms of hemorrhoids persist despite conservative therapy we can consider banding, hopefully that is not necessary.  He is not due for another colonoscopy till 2025.  May consider it sooner if symptoms persist.  Plan: - RUQ [place] - rule out gallstones - EGD at the [facility] - rule out BE and clear upper tract in  light of pain - CBC and LFTs today - Citrucel once daily - OTC - Calmol4 - PRN OTC - consider hemorrhoid banding if needed if symptoms persist over time - colonoscopy not due for another 2 years, consider sooner if symptoms persist  I spent 32 minutes of time, including in depth chart review,face-to-face time with the patient, coordinating care, and documentation of this encounter.   Jolly Mango, MD [facility]

## 2021-09-17 NOTE — Patient Instructions (Addendum)
If you are age 58 or younger, your body mass index should be between 19-25. Your Body mass index is 29.24 kg/m. If this is out of the aformentioned range listed, please consider follow up with your Primary Care Provider.  ________________________________________________________  The [facility] providers would like to encourage you to use [facility] to communicate with providers for non-urgent requests or questions.  Due to long hold times on the telephone, sending your provider a message by [facility] may be a faster and more efficient way to get a response.  Please allow 48 business hours for a response.  Please remember that this is for non-urgent requests.  _______________________________________________________  Your provider has requested that you go to the basement level for lab work before leaving today. Press "B" on the elevator. The lab is located at the first door on the left as you exit the elevator.  You have been scheduled for an endoscopy. Please follow written instructions given to you at your visit today. If you use inhalers (even only as needed), please bring them with you on the day of your procedure.  You will be contacted by [facility] in the next 2 days to arrange a Right Upper Quadrant Ultrasound.  The number on your caller ID will be [phone], please answer when they call.  If you have not heard from them in 2 days please call [phone] to schedule.    START  the following over the counter medications: Citrucel daily as directed Calmol 4 suppositories as directed    Follow up pending at this time.

## 2021-09-24 ENCOUNTER — Other Ambulatory Visit (HOSPITAL_COMMUNITY): Payer: Self-pay

## 2021-09-25 ENCOUNTER — Encounter (HOSPITAL_COMMUNITY): Payer: Self-pay

## 2021-09-25 ENCOUNTER — Ambulatory Visit (HOSPITAL_COMMUNITY): Payer: BC Managed Care – PPO

## 2021-10-01 ENCOUNTER — Encounter: Payer: BC Managed Care – PPO | Admitting: Gastroenterology

## 2021-10-09 ENCOUNTER — Encounter: Payer: Self-pay | Admitting: Gastroenterology

## 2021-10-12 ENCOUNTER — Other Ambulatory Visit (HOSPITAL_COMMUNITY): Payer: Self-pay

## 2021-10-12 MED ORDER — OXYCODONE HCL 20 MG PO TABS
ORAL_TABLET | ORAL | 0 refills | Status: DC
  Filled 2021-10-12: qty 108, 27d supply, fill #0

## 2021-10-13 ENCOUNTER — Other Ambulatory Visit (HOSPITAL_COMMUNITY): Payer: Self-pay

## 2021-10-13 MED ORDER — BUPRENORPHINE HCL 8 MG SL SUBL
8.0000 mg | SUBLINGUAL_TABLET | Freq: Two times a day (BID) | SUBLINGUAL | 0 refills | Status: DC
  Filled 2021-10-17: qty 60, 30d supply, fill #0

## 2021-10-13 MED ORDER — DIAZEPAM 5 MG PO TABS
5.0000 mg | ORAL_TABLET | Freq: Two times a day (BID) | ORAL | 0 refills | Status: AC | PRN
  Filled 2021-10-13 – 2021-10-17 (×2): qty 60, 30d supply, fill #0

## 2021-10-15 ENCOUNTER — Other Ambulatory Visit (HOSPITAL_COMMUNITY): Payer: Self-pay

## 2021-10-16 ENCOUNTER — Encounter: Payer: BC Managed Care – PPO | Admitting: Gastroenterology

## 2021-10-17 ENCOUNTER — Other Ambulatory Visit (HOSPITAL_COMMUNITY): Payer: Self-pay

## 2021-10-23 ENCOUNTER — Other Ambulatory Visit (HOSPITAL_COMMUNITY): Payer: Self-pay

## 2021-11-08 ENCOUNTER — Other Ambulatory Visit (HOSPITAL_COMMUNITY): Payer: Self-pay

## 2021-11-08 MED ORDER — SERTRALINE HCL 100 MG PO TABS
150.0000 mg | ORAL_TABLET | Freq: Every day | ORAL | 1 refills | Status: DC
  Filled 2021-11-08: qty 135, 90d supply, fill #0
  Filled 2022-03-20: qty 135, 90d supply, fill #1

## 2021-11-09 ENCOUNTER — Other Ambulatory Visit (HOSPITAL_COMMUNITY): Payer: Self-pay

## 2021-11-09 MED ORDER — METFORMIN HCL 1000 MG PO TABS
1000.0000 mg | ORAL_TABLET | Freq: Two times a day (BID) | ORAL | 0 refills | Status: DC
  Filled 2021-11-09: qty 180, 90d supply, fill #0

## 2021-11-10 ENCOUNTER — Other Ambulatory Visit (HOSPITAL_COMMUNITY): Payer: Self-pay

## 2021-11-10 MED ORDER — DIAZEPAM 5 MG PO TABS
ORAL_TABLET | ORAL | 0 refills | Status: AC
  Filled 2021-11-16: qty 60, 30d supply, fill #0

## 2021-11-10 MED ORDER — OXYCODONE HCL 20 MG PO TABS
ORAL_TABLET | ORAL | 0 refills | Status: DC
  Filled 2021-11-10: qty 120, 30d supply, fill #0

## 2021-11-10 MED ORDER — BUPRENORPHINE HCL 8 MG SL SUBL
SUBLINGUAL_TABLET | SUBLINGUAL | 0 refills | Status: DC
  Filled 2021-11-16: qty 60, 30d supply, fill #0

## 2021-11-12 ENCOUNTER — Other Ambulatory Visit (HOSPITAL_COMMUNITY): Payer: Self-pay

## 2021-11-13 ENCOUNTER — Other Ambulatory Visit (HOSPITAL_COMMUNITY): Payer: Self-pay

## 2021-11-14 ENCOUNTER — Other Ambulatory Visit (HOSPITAL_COMMUNITY): Payer: Self-pay

## 2021-11-16 ENCOUNTER — Other Ambulatory Visit (HOSPITAL_COMMUNITY): Payer: Self-pay

## 2021-12-11 ENCOUNTER — Other Ambulatory Visit (HOSPITAL_COMMUNITY): Payer: Self-pay

## 2021-12-11 MED ORDER — OXYCODONE HCL 20 MG PO TABS
20.0000 mg | ORAL_TABLET | Freq: Four times a day (QID) | ORAL | 0 refills | Status: DC | PRN
  Filled 2021-12-11: qty 120, 30d supply, fill #0

## 2021-12-11 MED ORDER — BUPRENORPHINE HCL 8 MG SL SUBL
8.0000 mg | SUBLINGUAL_TABLET | Freq: Two times a day (BID) | SUBLINGUAL | 0 refills | Status: DC
  Filled 2021-12-11 – 2021-12-17 (×2): qty 60, 30d supply, fill #0

## 2021-12-11 MED ORDER — DIAZEPAM 5 MG PO TABS
5.0000 mg | ORAL_TABLET | Freq: Two times a day (BID) | ORAL | 0 refills | Status: DC | PRN
  Filled 2021-12-11 – 2021-12-17 (×2): qty 60, 30d supply, fill #0

## 2021-12-15 ENCOUNTER — Other Ambulatory Visit (HOSPITAL_COMMUNITY): Payer: Self-pay

## 2021-12-17 ENCOUNTER — Other Ambulatory Visit (HOSPITAL_COMMUNITY): Payer: Self-pay

## 2022-01-07 ENCOUNTER — Other Ambulatory Visit (HOSPITAL_COMMUNITY): Payer: Self-pay

## 2022-01-07 MED ORDER — BUPRENORPHINE HCL 8 MG SL SUBL
8.0000 mg | SUBLINGUAL_TABLET | Freq: Two times a day (BID) | SUBLINGUAL | 0 refills | Status: DC
  Filled 2022-01-18: qty 60, 30d supply, fill #0

## 2022-01-07 MED ORDER — DIAZEPAM 5 MG PO TABS
5.0000 mg | ORAL_TABLET | Freq: Two times a day (BID) | ORAL | 0 refills | Status: DC | PRN
  Filled 2022-01-18: qty 60, 30d supply, fill #0

## 2022-01-07 MED ORDER — OXYCODONE HCL 20 MG PO TABS
20.0000 mg | ORAL_TABLET | Freq: Four times a day (QID) | ORAL | 0 refills | Status: DC | PRN
  Filled 2022-01-08: qty 120, 30d supply, fill #0

## 2022-01-08 ENCOUNTER — Other Ambulatory Visit (HOSPITAL_COMMUNITY): Payer: Self-pay

## 2022-01-17 ENCOUNTER — Other Ambulatory Visit (HOSPITAL_COMMUNITY): Payer: Self-pay

## 2022-01-18 ENCOUNTER — Other Ambulatory Visit (HOSPITAL_COMMUNITY): Payer: Self-pay

## 2022-01-23 ENCOUNTER — Other Ambulatory Visit (HOSPITAL_COMMUNITY): Payer: Self-pay

## 2022-01-23 MED ORDER — ALLOPURINOL 100 MG PO TABS
100.0000 mg | ORAL_TABLET | Freq: Every day | ORAL | 2 refills | Status: DC
  Filled 2022-01-23: qty 90, 90d supply, fill #0
  Filled 2022-04-22: qty 90, 90d supply, fill #1
  Filled 2022-07-19: qty 90, 90d supply, fill #2

## 2022-02-06 ENCOUNTER — Other Ambulatory Visit (HOSPITAL_COMMUNITY): Payer: Self-pay

## 2022-02-06 MED ORDER — LOSARTAN POTASSIUM 25 MG PO TABS
25.0000 mg | ORAL_TABLET | Freq: Every day | ORAL | 1 refills | Status: DC
  Filled 2022-02-06: qty 90, 90d supply, fill #0
  Filled 2022-05-03 – 2022-05-06 (×2): qty 90, 90d supply, fill #1

## 2022-02-08 ENCOUNTER — Other Ambulatory Visit (HOSPITAL_COMMUNITY): Payer: Self-pay

## 2022-02-08 MED ORDER — OXYCODONE HCL 20 MG PO TABS
20.0000 mg | ORAL_TABLET | Freq: Four times a day (QID) | ORAL | 0 refills | Status: DC | PRN
  Filled 2022-02-08: qty 120, 30d supply, fill #0

## 2022-02-09 ENCOUNTER — Other Ambulatory Visit (HOSPITAL_COMMUNITY): Payer: Self-pay

## 2022-02-09 MED ORDER — DIAZEPAM 5 MG PO TABS
5.0000 mg | ORAL_TABLET | Freq: Two times a day (BID) | ORAL | 0 refills | Status: DC | PRN
  Filled 2022-02-14: qty 60, 30d supply, fill #0

## 2022-02-09 MED ORDER — BUPRENORPHINE HCL 8 MG SL SUBL
8.0000 mg | SUBLINGUAL_TABLET | Freq: Two times a day (BID) | SUBLINGUAL | 0 refills | Status: DC
  Filled 2022-02-09 – 2022-02-18 (×3): qty 60, 30d supply, fill #0

## 2022-02-13 ENCOUNTER — Other Ambulatory Visit (HOSPITAL_COMMUNITY): Payer: Self-pay

## 2022-02-13 MED ORDER — ARIPIPRAZOLE 5 MG PO TABS
5.0000 mg | ORAL_TABLET | Freq: Every day | ORAL | 1 refills | Status: DC
  Filled 2022-02-13: qty 90, 90d supply, fill #0
  Filled 2022-05-10: qty 90, 90d supply, fill #1

## 2022-02-14 ENCOUNTER — Other Ambulatory Visit (HOSPITAL_COMMUNITY): Payer: Self-pay

## 2022-02-16 ENCOUNTER — Other Ambulatory Visit (HOSPITAL_COMMUNITY): Payer: Self-pay

## 2022-02-18 ENCOUNTER — Other Ambulatory Visit (HOSPITAL_COMMUNITY): Payer: Self-pay

## 2022-02-18 MED ORDER — PRAVASTATIN SODIUM 10 MG PO TABS
10.0000 mg | ORAL_TABLET | Freq: Every day | ORAL | 1 refills | Status: DC
  Filled 2022-02-18: qty 90, 90d supply, fill #0
  Filled 2022-05-15: qty 90, 90d supply, fill #1

## 2022-03-02 ENCOUNTER — Other Ambulatory Visit (HOSPITAL_COMMUNITY): Payer: Self-pay

## 2022-03-03 ENCOUNTER — Other Ambulatory Visit (HOSPITAL_COMMUNITY): Payer: Self-pay

## 2022-03-03 MED ORDER — METFORMIN HCL 1000 MG PO TABS
1000.0000 mg | ORAL_TABLET | Freq: Two times a day (BID) | ORAL | 1 refills | Status: DC
  Filled 2022-03-03: qty 180, 90d supply, fill #0
  Filled 2022-06-13: qty 180, 90d supply, fill #1

## 2022-03-05 ENCOUNTER — Other Ambulatory Visit (HOSPITAL_COMMUNITY): Payer: Self-pay

## 2022-03-11 ENCOUNTER — Other Ambulatory Visit (HOSPITAL_COMMUNITY): Payer: Self-pay

## 2022-03-11 MED ORDER — DIAZEPAM 5 MG PO TABS
5.0000 mg | ORAL_TABLET | Freq: Two times a day (BID) | ORAL | 0 refills | Status: DC | PRN
  Filled 2022-03-15: qty 60, 30d supply, fill #0

## 2022-03-11 MED ORDER — OXYCODONE HCL 20 MG PO TABS
20.0000 mg | ORAL_TABLET | Freq: Four times a day (QID) | ORAL | 0 refills | Status: AC | PRN
  Filled 2022-03-11: qty 120, 30d supply, fill #0

## 2022-03-11 MED ORDER — BUPRENORPHINE HCL 8 MG SL SUBL
8.0000 mg | SUBLINGUAL_TABLET | Freq: Two times a day (BID) | SUBLINGUAL | 0 refills | Status: DC
  Filled 2022-03-20: qty 60, 30d supply, fill #0

## 2022-03-12 ENCOUNTER — Other Ambulatory Visit (HOSPITAL_COMMUNITY): Payer: Self-pay

## 2022-03-15 ENCOUNTER — Other Ambulatory Visit (HOSPITAL_COMMUNITY): Payer: Self-pay

## 2022-03-20 ENCOUNTER — Other Ambulatory Visit (HOSPITAL_COMMUNITY): Payer: Self-pay

## 2022-04-09 ENCOUNTER — Other Ambulatory Visit (HOSPITAL_COMMUNITY): Payer: Self-pay

## 2022-04-09 MED ORDER — DIAZEPAM 5 MG PO TABS
5.0000 mg | ORAL_TABLET | Freq: Two times a day (BID) | ORAL | 0 refills | Status: DC | PRN
  Filled 2022-04-09 – 2022-04-13 (×3): qty 60, 30d supply, fill #0

## 2022-04-09 MED ORDER — BUPRENORPHINE HCL 8 MG SL SUBL
SUBLINGUAL_TABLET | SUBLINGUAL | 0 refills | Status: DC
  Filled 2022-04-09: qty 15, 7d supply, fill #0
  Filled 2022-04-09 – 2022-04-17 (×2): qty 60, 30d supply, fill #0
  Filled 2022-04-19: qty 15, 7d supply, fill #0
  Filled 2022-04-19: qty 45, 22d supply, fill #1
  Filled ????-??-??: fill #0

## 2022-04-09 MED ORDER — OXYCODONE HCL 20 MG PO TABS
ORAL_TABLET | ORAL | 0 refills | Status: AC
  Filled 2022-04-09 – 2022-04-10 (×3): qty 120, 30d supply, fill #0
  Filled ????-??-??: fill #0

## 2022-04-10 ENCOUNTER — Other Ambulatory Visit (HOSPITAL_COMMUNITY): Payer: Self-pay

## 2022-04-13 ENCOUNTER — Other Ambulatory Visit (HOSPITAL_COMMUNITY): Payer: Self-pay

## 2022-04-17 ENCOUNTER — Other Ambulatory Visit (HOSPITAL_COMMUNITY): Payer: Self-pay

## 2022-04-19 ENCOUNTER — Other Ambulatory Visit (HOSPITAL_COMMUNITY): Payer: Self-pay

## 2022-04-22 ENCOUNTER — Other Ambulatory Visit (HOSPITAL_COMMUNITY): Payer: Self-pay

## 2022-05-06 ENCOUNTER — Other Ambulatory Visit (HOSPITAL_COMMUNITY): Payer: Self-pay

## 2022-05-06 ENCOUNTER — Other Ambulatory Visit: Payer: Self-pay

## 2022-05-07 ENCOUNTER — Other Ambulatory Visit (HOSPITAL_COMMUNITY): Payer: Self-pay

## 2022-05-07 MED ORDER — DIAZEPAM 5 MG PO TABS
5.0000 mg | ORAL_TABLET | Freq: Two times a day (BID) | ORAL | 0 refills | Status: DC | PRN
  Filled 2022-05-07 – 2022-05-13 (×2): qty 60, 30d supply, fill #0

## 2022-05-07 MED ORDER — BUPRENORPHINE HCL 8 MG SL SUBL
SUBLINGUAL_TABLET | SUBLINGUAL | 0 refills | Status: DC
  Filled 2022-05-07 – 2022-05-20 (×2): qty 60, 30d supply, fill #0

## 2022-05-07 MED ORDER — OXYCODONE HCL 20 MG PO TABS
20.0000 mg | ORAL_TABLET | Freq: Four times a day (QID) | ORAL | 0 refills | Status: DC | PRN
  Filled 2022-05-07: qty 120, 30d supply, fill #0
  Filled 2022-05-10: qty 100, 25d supply, fill #0
  Filled 2022-05-10: qty 20, 5d supply, fill #0
  Filled 2022-05-10: qty 120, 30d supply, fill #0

## 2022-05-08 ENCOUNTER — Other Ambulatory Visit (HOSPITAL_COMMUNITY): Payer: Self-pay

## 2022-05-10 ENCOUNTER — Other Ambulatory Visit (HOSPITAL_COMMUNITY): Payer: Self-pay

## 2022-05-11 ENCOUNTER — Other Ambulatory Visit: Payer: Self-pay

## 2022-05-13 ENCOUNTER — Other Ambulatory Visit (HOSPITAL_COMMUNITY): Payer: Self-pay

## 2022-05-16 ENCOUNTER — Other Ambulatory Visit (HOSPITAL_COMMUNITY): Payer: Self-pay

## 2022-05-20 ENCOUNTER — Other Ambulatory Visit (HOSPITAL_COMMUNITY): Payer: Self-pay

## 2022-05-20 ENCOUNTER — Other Ambulatory Visit: Payer: Self-pay

## 2022-06-07 ENCOUNTER — Other Ambulatory Visit (HOSPITAL_COMMUNITY): Payer: Self-pay

## 2022-06-08 ENCOUNTER — Other Ambulatory Visit (HOSPITAL_COMMUNITY): Payer: Self-pay

## 2022-06-08 MED ORDER — BUPRENORPHINE HCL 8 MG SL SUBL
8.0000 mg | SUBLINGUAL_TABLET | Freq: Two times a day (BID) | SUBLINGUAL | 0 refills | Status: DC
  Filled 2022-06-20: qty 60, 30d supply, fill #0

## 2022-06-08 MED ORDER — DIAZEPAM 5 MG PO TABS
5.0000 mg | ORAL_TABLET | Freq: Two times a day (BID) | ORAL | 0 refills | Status: DC | PRN
  Filled 2022-06-13: qty 60, 30d supply, fill #0

## 2022-06-08 MED ORDER — OXYCODONE HCL 20 MG PO TABS
20.0000 mg | ORAL_TABLET | Freq: Four times a day (QID) | ORAL | 0 refills | Status: DC | PRN
  Filled 2022-06-10: qty 120, 30d supply, fill #0

## 2022-06-10 ENCOUNTER — Other Ambulatory Visit (HOSPITAL_COMMUNITY): Payer: Self-pay

## 2022-06-13 ENCOUNTER — Other Ambulatory Visit (HOSPITAL_COMMUNITY): Payer: Self-pay

## 2022-06-20 ENCOUNTER — Other Ambulatory Visit (HOSPITAL_COMMUNITY): Payer: Self-pay

## 2022-06-21 ENCOUNTER — Other Ambulatory Visit (HOSPITAL_COMMUNITY): Payer: Self-pay

## 2022-06-21 ENCOUNTER — Other Ambulatory Visit: Payer: Self-pay

## 2022-07-08 ENCOUNTER — Other Ambulatory Visit (HOSPITAL_COMMUNITY): Payer: Self-pay

## 2022-07-09 ENCOUNTER — Other Ambulatory Visit (HOSPITAL_COMMUNITY): Payer: Self-pay

## 2022-07-09 MED ORDER — DIAZEPAM 5 MG PO TABS
5.0000 mg | ORAL_TABLET | Freq: Two times a day (BID) | ORAL | 0 refills | Status: DC | PRN
  Filled 2022-07-13: qty 60, 30d supply, fill #0

## 2022-07-09 MED ORDER — BUPRENORPHINE HCL 8 MG SL SUBL
8.0000 mg | SUBLINGUAL_TABLET | Freq: Two times a day (BID) | SUBLINGUAL | 0 refills | Status: DC
  Filled 2022-07-22: qty 60, 30d supply, fill #0

## 2022-07-09 MED ORDER — OXYCODONE HCL 20 MG PO TABS
20.0000 mg | ORAL_TABLET | Freq: Four times a day (QID) | ORAL | 0 refills | Status: DC | PRN
  Filled 2022-07-10: qty 120, 30d supply, fill #0

## 2022-07-10 ENCOUNTER — Other Ambulatory Visit (HOSPITAL_COMMUNITY): Payer: Self-pay

## 2022-07-12 ENCOUNTER — Other Ambulatory Visit (HOSPITAL_COMMUNITY): Payer: Self-pay

## 2022-07-13 ENCOUNTER — Other Ambulatory Visit (HOSPITAL_COMMUNITY): Payer: Self-pay

## 2022-07-17 ENCOUNTER — Encounter: Admitting: Genetic Counselor

## 2022-07-22 ENCOUNTER — Other Ambulatory Visit (HOSPITAL_COMMUNITY): Payer: Self-pay

## 2022-07-29 ENCOUNTER — Other Ambulatory Visit (HOSPITAL_COMMUNITY): Payer: Self-pay

## 2022-07-30 ENCOUNTER — Other Ambulatory Visit (HOSPITAL_COMMUNITY): Payer: Self-pay

## 2022-07-30 MED ORDER — LOSARTAN POTASSIUM 25 MG PO TABS
ORAL_TABLET | ORAL | 0 refills | Status: DC
  Filled 2022-07-30: qty 90, 90d supply, fill #0

## 2022-07-30 MED ORDER — SERTRALINE HCL 100 MG PO TABS
100.0000 mg | ORAL_TABLET | Freq: Every day | ORAL | 0 refills | Status: DC
  Filled 2022-07-30: qty 90, 90d supply, fill #0

## 2022-07-31 ENCOUNTER — Other Ambulatory Visit (HOSPITAL_COMMUNITY): Payer: Self-pay

## 2022-07-31 MED ORDER — FLUTICASONE-SALMETEROL 100-50 MCG/ACT IN AEPB
1.0000 | INHALATION_SPRAY | Freq: Two times a day (BID) | RESPIRATORY_TRACT | 3 refills | Status: AC
  Filled 2022-07-31: qty 180, 90d supply, fill #0

## 2022-08-07 ENCOUNTER — Other Ambulatory Visit (HOSPITAL_COMMUNITY): Payer: Self-pay

## 2022-08-07 MED ORDER — DIAZEPAM 5 MG PO TABS
5.0000 mg | ORAL_TABLET | Freq: Two times a day (BID) | ORAL | 0 refills | Status: DC | PRN
  Filled 2022-08-12: qty 60, 30d supply, fill #0

## 2022-08-07 MED ORDER — BUPRENORPHINE HCL 8 MG SL SUBL
SUBLINGUAL_TABLET | SUBLINGUAL | 0 refills | Status: DC
  Filled 2022-08-19: qty 60, 30d supply, fill #0

## 2022-08-07 MED ORDER — OXYCODONE HCL 20 MG PO TABS
20.0000 mg | ORAL_TABLET | Freq: Four times a day (QID) | ORAL | 0 refills | Status: DC | PRN
  Filled 2022-08-09: qty 120, 30d supply, fill #0

## 2022-08-08 ENCOUNTER — Other Ambulatory Visit (HOSPITAL_COMMUNITY): Payer: Self-pay

## 2022-08-08 MED ORDER — ARIPIPRAZOLE 5 MG PO TABS
5.0000 mg | ORAL_TABLET | Freq: Every day | ORAL | 1 refills | Status: DC
  Filled 2022-08-08: qty 90, 90d supply, fill #0
  Filled 2022-10-31: qty 90, 90d supply, fill #1

## 2022-08-09 ENCOUNTER — Other Ambulatory Visit (HOSPITAL_COMMUNITY): Payer: Self-pay

## 2022-08-12 ENCOUNTER — Other Ambulatory Visit (HOSPITAL_COMMUNITY): Payer: Self-pay

## 2022-08-12 ENCOUNTER — Other Ambulatory Visit: Payer: Self-pay

## 2022-08-12 MED ORDER — PRAVASTATIN SODIUM 10 MG PO TABS
10.0000 mg | ORAL_TABLET | Freq: Every day | ORAL | 1 refills | Status: AC
  Filled 2022-08-12: qty 90, 90d supply, fill #0
  Filled 2022-11-08 (×2): qty 90, 90d supply, fill #1

## 2022-08-19 ENCOUNTER — Other Ambulatory Visit (HOSPITAL_COMMUNITY): Payer: Self-pay

## 2022-09-03 ENCOUNTER — Other Ambulatory Visit (HOSPITAL_COMMUNITY): Payer: Self-pay

## 2022-09-03 MED ORDER — DIAZEPAM 5 MG PO TABS
5.0000 mg | ORAL_TABLET | Freq: Two times a day (BID) | ORAL | 0 refills | Status: DC | PRN
  Filled 2022-09-11: qty 60, 30d supply, fill #0

## 2022-09-03 MED ORDER — BUPRENORPHINE HCL 8 MG SL SUBL
SUBLINGUAL_TABLET | SUBLINGUAL | 0 refills | Status: DC
  Filled 2022-09-18: qty 60, 30d supply, fill #0

## 2022-09-03 MED ORDER — OXYCODONE HCL 20 MG PO TABS
20.0000 mg | ORAL_TABLET | Freq: Four times a day (QID) | ORAL | 0 refills | Status: AC | PRN
  Filled 2022-09-07 – 2022-09-09 (×2): qty 120, 30d supply, fill #0

## 2022-09-07 ENCOUNTER — Other Ambulatory Visit (HOSPITAL_COMMUNITY): Payer: Self-pay

## 2022-09-09 ENCOUNTER — Other Ambulatory Visit (HOSPITAL_COMMUNITY): Payer: Self-pay

## 2022-09-11 ENCOUNTER — Other Ambulatory Visit (HOSPITAL_COMMUNITY): Payer: Self-pay

## 2022-09-12 ENCOUNTER — Other Ambulatory Visit (HOSPITAL_COMMUNITY): Payer: Self-pay

## 2022-09-18 ENCOUNTER — Other Ambulatory Visit (HOSPITAL_COMMUNITY): Payer: Self-pay

## 2022-10-02 ENCOUNTER — Other Ambulatory Visit (HOSPITAL_COMMUNITY): Payer: Self-pay

## 2022-10-02 MED ORDER — BUPRENORPHINE HCL 8 MG SL SUBL
8.0000 mg | SUBLINGUAL_TABLET | Freq: Two times a day (BID) | SUBLINGUAL | 0 refills | Status: DC
  Filled 2022-10-18: qty 60, 30d supply, fill #0

## 2022-10-02 MED ORDER — OXYCODONE HCL 20 MG PO TABS
20.0000 mg | ORAL_TABLET | Freq: Four times a day (QID) | ORAL | 0 refills | Status: AC | PRN
  Filled 2022-10-08 – 2022-10-09 (×3): qty 120, 30d supply, fill #0

## 2022-10-02 MED ORDER — DIAZEPAM 5 MG PO TABS
5.0000 mg | ORAL_TABLET | Freq: Two times a day (BID) | ORAL | 0 refills | Status: DC | PRN
  Filled 2022-10-11: qty 60, 30d supply, fill #0

## 2022-10-08 ENCOUNTER — Other Ambulatory Visit (HOSPITAL_COMMUNITY): Payer: Self-pay

## 2022-10-08 MED ORDER — METFORMIN HCL 1000 MG PO TABS
1000.0000 mg | ORAL_TABLET | Freq: Two times a day (BID) | ORAL | 1 refills | Status: DC
  Filled 2022-10-08: qty 180, 90d supply, fill #0
  Filled 2023-01-03: qty 180, 90d supply, fill #1

## 2022-10-09 ENCOUNTER — Other Ambulatory Visit (HOSPITAL_COMMUNITY): Payer: Self-pay

## 2022-10-11 ENCOUNTER — Other Ambulatory Visit (HOSPITAL_COMMUNITY): Payer: Self-pay

## 2022-10-18 ENCOUNTER — Other Ambulatory Visit: Payer: Self-pay

## 2022-10-18 ENCOUNTER — Other Ambulatory Visit (HOSPITAL_COMMUNITY): Payer: Self-pay

## 2022-10-18 MED ORDER — ALLOPURINOL 100 MG PO TABS
100.0000 mg | ORAL_TABLET | Freq: Every day | ORAL | 2 refills | Status: DC
  Filled 2022-10-18: qty 90, 90d supply, fill #0
  Filled 2023-01-17: qty 90, 90d supply, fill #1
  Filled 2023-04-18: qty 90, 90d supply, fill #2

## 2022-10-29 ENCOUNTER — Other Ambulatory Visit (HOSPITAL_COMMUNITY): Payer: Self-pay

## 2022-10-29 ENCOUNTER — Other Ambulatory Visit (HOSPITAL_BASED_OUTPATIENT_CLINIC_OR_DEPARTMENT_OTHER): Payer: Self-pay

## 2022-10-29 MED ORDER — SERTRALINE HCL 100 MG PO TABS
100.0000 mg | ORAL_TABLET | Freq: Every day | ORAL | 0 refills | Status: DC
  Filled 2022-10-29: qty 90, 90d supply, fill #0

## 2022-10-29 MED ORDER — LOSARTAN POTASSIUM 25 MG PO TABS
ORAL_TABLET | ORAL | 0 refills | Status: DC
  Filled 2022-10-29: qty 90, 90d supply, fill #0

## 2022-10-30 ENCOUNTER — Other Ambulatory Visit (HOSPITAL_COMMUNITY): Payer: Self-pay

## 2022-10-30 MED ORDER — BUPRENORPHINE HCL 8 MG SL SUBL
8.0000 mg | SUBLINGUAL_TABLET | Freq: Two times a day (BID) | SUBLINGUAL | 0 refills | Status: DC
  Filled 2022-11-19: qty 60, 30d supply, fill #0

## 2022-10-30 MED ORDER — OXYCODONE HCL 20 MG PO TABS
20.0000 mg | ORAL_TABLET | Freq: Four times a day (QID) | ORAL | 0 refills | Status: AC | PRN
  Filled 2022-11-08: qty 120, 30d supply, fill #0

## 2022-10-30 MED ORDER — DIAZEPAM 5 MG PO TABS
5.0000 mg | ORAL_TABLET | Freq: Two times a day (BID) | ORAL | 0 refills | Status: DC | PRN
  Filled 2022-11-11: qty 60, 30d supply, fill #0

## 2022-11-01 ENCOUNTER — Other Ambulatory Visit (HOSPITAL_COMMUNITY): Payer: Self-pay

## 2022-11-07 ENCOUNTER — Other Ambulatory Visit (HOSPITAL_COMMUNITY): Payer: Self-pay

## 2022-11-07 ENCOUNTER — Other Ambulatory Visit: Payer: Self-pay

## 2022-11-08 ENCOUNTER — Other Ambulatory Visit: Payer: Self-pay

## 2022-11-08 ENCOUNTER — Other Ambulatory Visit (HOSPITAL_COMMUNITY): Payer: Self-pay

## 2022-11-11 ENCOUNTER — Other Ambulatory Visit (HOSPITAL_COMMUNITY): Payer: Self-pay

## 2022-11-19 ENCOUNTER — Other Ambulatory Visit (HOSPITAL_COMMUNITY): Payer: Self-pay

## 2022-11-27 ENCOUNTER — Other Ambulatory Visit (HOSPITAL_COMMUNITY): Payer: Self-pay

## 2022-11-27 MED ORDER — OXYCODONE HCL 20 MG PO TABS
ORAL_TABLET | ORAL | 0 refills | Status: AC
Start: 2022-11-27 — End: ?
  Filled 2022-12-07 – 2022-12-09 (×2): qty 120, 30d supply, fill #0

## 2022-11-27 MED ORDER — BUPRENORPHINE HCL 8 MG SL SUBL
8.0000 mg | SUBLINGUAL_TABLET | Freq: Two times a day (BID) | SUBLINGUAL | 0 refills | Status: DC
Start: 2022-12-19 — End: 2022-12-30
  Filled 2022-11-27 – 2022-12-19 (×2): qty 60, 30d supply, fill #0

## 2022-11-27 MED ORDER — DIAZEPAM 5 MG PO TABS
5.0000 mg | ORAL_TABLET | Freq: Two times a day (BID) | ORAL | 0 refills | Status: DC | PRN
  Filled 2022-12-11: qty 60, 30d supply, fill #0

## 2022-11-28 ENCOUNTER — Other Ambulatory Visit (HOSPITAL_COMMUNITY): Payer: Self-pay

## 2022-11-29 ENCOUNTER — Other Ambulatory Visit (HOSPITAL_COMMUNITY): Payer: Self-pay

## 2022-12-07 ENCOUNTER — Other Ambulatory Visit (HOSPITAL_COMMUNITY): Payer: Self-pay

## 2022-12-09 ENCOUNTER — Other Ambulatory Visit (HOSPITAL_COMMUNITY): Payer: Self-pay

## 2022-12-11 ENCOUNTER — Other Ambulatory Visit (HOSPITAL_COMMUNITY): Payer: Self-pay

## 2022-12-19 ENCOUNTER — Other Ambulatory Visit (HOSPITAL_COMMUNITY): Payer: Self-pay

## 2022-12-30 ENCOUNTER — Other Ambulatory Visit (HOSPITAL_COMMUNITY): Payer: Self-pay

## 2022-12-30 MED ORDER — DIAZEPAM 5 MG PO TABS
5.0000 mg | ORAL_TABLET | Freq: Two times a day (BID) | ORAL | 0 refills | Status: DC | PRN
  Filled 2023-01-10: qty 60, 30d supply, fill #0

## 2022-12-30 MED ORDER — NALOXONE HCL 4 MG/0.1ML NA LIQD
NASAL | 1 refills | Status: AC
Start: 2022-12-30 — End: ?
  Filled 2022-12-30: qty 2, 7d supply, fill #0
  Filled 2022-12-30: qty 2, 30d supply, fill #0

## 2022-12-30 MED ORDER — OXYCODONE HCL 20 MG PO TABS
20.0000 mg | ORAL_TABLET | Freq: Four times a day (QID) | ORAL | 0 refills | Status: AC | PRN
  Filled 2023-01-08: qty 120, 30d supply, fill #0

## 2022-12-30 MED ORDER — BUPRENORPHINE HCL 8 MG SL SUBL
8.0000 mg | SUBLINGUAL_TABLET | Freq: Two times a day (BID) | SUBLINGUAL | 0 refills | Status: DC
  Filled 2023-01-18: qty 60, 30d supply, fill #0

## 2022-12-31 ENCOUNTER — Other Ambulatory Visit: Payer: Self-pay

## 2023-01-03 ENCOUNTER — Other Ambulatory Visit (HOSPITAL_COMMUNITY): Payer: Self-pay

## 2023-01-07 ENCOUNTER — Other Ambulatory Visit (HOSPITAL_COMMUNITY): Payer: Self-pay

## 2023-01-08 ENCOUNTER — Other Ambulatory Visit (HOSPITAL_COMMUNITY): Payer: Self-pay

## 2023-01-10 ENCOUNTER — Other Ambulatory Visit (HOSPITAL_COMMUNITY): Payer: Self-pay

## 2023-01-17 ENCOUNTER — Other Ambulatory Visit (HOSPITAL_COMMUNITY): Payer: Self-pay

## 2023-01-18 ENCOUNTER — Other Ambulatory Visit (HOSPITAL_COMMUNITY): Payer: Self-pay

## 2023-01-28 ENCOUNTER — Other Ambulatory Visit (HOSPITAL_COMMUNITY): Payer: Self-pay

## 2023-01-28 ENCOUNTER — Other Ambulatory Visit: Payer: Self-pay

## 2023-01-28 MED ORDER — SERTRALINE HCL 100 MG PO TABS
100.0000 mg | ORAL_TABLET | Freq: Every day | ORAL | 0 refills | Status: DC
  Filled 2023-01-28: qty 90, 90d supply, fill #0

## 2023-01-28 MED ORDER — LOSARTAN POTASSIUM 25 MG PO TABS
25.0000 mg | ORAL_TABLET | Freq: Every day | ORAL | 0 refills | Status: DC
  Filled 2023-01-28: qty 90, 90d supply, fill #0

## 2023-01-29 ENCOUNTER — Other Ambulatory Visit (HOSPITAL_COMMUNITY): Payer: Self-pay

## 2023-02-05 ENCOUNTER — Other Ambulatory Visit (HOSPITAL_COMMUNITY): Payer: Self-pay

## 2023-02-05 MED ORDER — BUPRENORPHINE HCL 8 MG SL SUBL
SUBLINGUAL_TABLET | SUBLINGUAL | 0 refills | Status: DC
  Filled 2023-02-17: qty 60, 30d supply, fill #0

## 2023-02-05 MED ORDER — DIAZEPAM 5 MG PO TABS
5.0000 mg | ORAL_TABLET | Freq: Two times a day (BID) | ORAL | 0 refills | Status: DC | PRN
  Filled 2023-02-10: qty 60, 30d supply, fill #0

## 2023-02-05 MED ORDER — OXYCODONE HCL 20 MG PO TABS
20.0000 mg | ORAL_TABLET | Freq: Four times a day (QID) | ORAL | 0 refills | Status: AC | PRN
  Filled 2023-02-07: qty 120, 30d supply, fill #0

## 2023-02-07 ENCOUNTER — Other Ambulatory Visit (HOSPITAL_COMMUNITY): Payer: Self-pay

## 2023-02-07 MED ORDER — ARIPIPRAZOLE 5 MG PO TABS
5.0000 mg | ORAL_TABLET | Freq: Every day | ORAL | 0 refills | Status: DC
  Filled 2023-02-07: qty 90, 90d supply, fill #0

## 2023-02-10 ENCOUNTER — Other Ambulatory Visit (HOSPITAL_COMMUNITY): Payer: Self-pay

## 2023-02-17 ENCOUNTER — Other Ambulatory Visit (HOSPITAL_COMMUNITY): Payer: Self-pay

## 2023-02-20 ENCOUNTER — Other Ambulatory Visit (HOSPITAL_COMMUNITY): Payer: Self-pay

## 2023-02-20 MED ORDER — FLUTICASONE-SALMETEROL 100-50 MCG/ACT IN AEPB
1.0000 | INHALATION_SPRAY | Freq: Two times a day (BID) | RESPIRATORY_TRACT | 3 refills | Status: AC
  Filled 2023-02-20: qty 180, 90d supply, fill #0
  Filled 2023-10-07: qty 180, 90d supply, fill #1

## 2023-02-20 MED ORDER — PRAVASTATIN SODIUM 10 MG PO TABS
10.0000 mg | ORAL_TABLET | Freq: Every day | ORAL | 1 refills | Status: DC
  Filled 2023-02-20: qty 90, 90d supply, fill #0
  Filled 2023-05-19: qty 90, 90d supply, fill #1

## 2023-02-22 ENCOUNTER — Other Ambulatory Visit (HOSPITAL_COMMUNITY): Payer: Self-pay

## 2023-03-08 ENCOUNTER — Other Ambulatory Visit (HOSPITAL_BASED_OUTPATIENT_CLINIC_OR_DEPARTMENT_OTHER): Payer: Self-pay

## 2023-03-08 ENCOUNTER — Other Ambulatory Visit (HOSPITAL_COMMUNITY): Payer: Self-pay

## 2023-03-08 MED ORDER — OXYCODONE HCL 20 MG PO TABS
20.0000 mg | ORAL_TABLET | Freq: Four times a day (QID) | ORAL | 0 refills | Status: AC
  Filled 2023-03-08 – 2023-03-10 (×2): qty 120, 30d supply, fill #0

## 2023-03-08 MED ORDER — BUPRENORPHINE HCL 8 MG SL SUBL
8.0000 mg | SUBLINGUAL_TABLET | Freq: Two times a day (BID) | SUBLINGUAL | 0 refills | Status: DC
  Filled 2023-03-08: qty 15, 8d supply, fill #0
  Filled 2023-03-19: qty 60, 30d supply, fill #0

## 2023-03-08 MED ORDER — DIAZEPAM 5 MG PO TABS
5.0000 mg | ORAL_TABLET | Freq: Two times a day (BID) | ORAL | 0 refills | Status: DC | PRN
  Filled 2023-03-08 – 2023-03-12 (×2): qty 60, 30d supply, fill #0

## 2023-03-10 ENCOUNTER — Other Ambulatory Visit (HOSPITAL_COMMUNITY): Payer: Self-pay

## 2023-03-11 ENCOUNTER — Other Ambulatory Visit (HOSPITAL_COMMUNITY): Payer: Self-pay

## 2023-03-12 ENCOUNTER — Other Ambulatory Visit: Payer: Self-pay

## 2023-03-12 ENCOUNTER — Other Ambulatory Visit (HOSPITAL_COMMUNITY): Payer: Self-pay

## 2023-03-19 ENCOUNTER — Other Ambulatory Visit (HOSPITAL_COMMUNITY): Payer: Self-pay

## 2023-04-07 ENCOUNTER — Other Ambulatory Visit (HOSPITAL_COMMUNITY): Payer: Self-pay

## 2023-04-07 MED ORDER — BUPRENORPHINE HCL 8 MG SL SUBL
SUBLINGUAL_TABLET | SUBLINGUAL | 0 refills | Status: DC
  Filled 2023-04-18: qty 60, 30d supply, fill #0

## 2023-04-07 MED ORDER — OXYCODONE HCL 20 MG PO TABS
20.0000 mg | ORAL_TABLET | Freq: Four times a day (QID) | ORAL | 0 refills | Status: DC | PRN
  Filled 2023-04-07 – 2023-04-09 (×2): qty 120, 30d supply, fill #0

## 2023-04-07 MED ORDER — DIAZEPAM 5 MG PO TABS
5.0000 mg | ORAL_TABLET | Freq: Two times a day (BID) | ORAL | 0 refills | Status: DC | PRN
  Filled 2023-04-11: qty 60, 30d supply, fill #0

## 2023-04-09 ENCOUNTER — Other Ambulatory Visit (HOSPITAL_COMMUNITY): Payer: Self-pay

## 2023-04-11 ENCOUNTER — Other Ambulatory Visit (HOSPITAL_COMMUNITY): Payer: Self-pay

## 2023-04-18 ENCOUNTER — Other Ambulatory Visit (HOSPITAL_COMMUNITY): Payer: Self-pay

## 2023-04-30 ENCOUNTER — Other Ambulatory Visit: Payer: Self-pay

## 2023-04-30 ENCOUNTER — Other Ambulatory Visit (HOSPITAL_COMMUNITY): Payer: Self-pay

## 2023-04-30 MED ORDER — LOSARTAN POTASSIUM 25 MG PO TABS
25.0000 mg | ORAL_TABLET | Freq: Every day | ORAL | 1 refills | Status: DC
  Filled 2023-04-30: qty 90, 90d supply, fill #0
  Filled 2023-07-29: qty 90, 90d supply, fill #1

## 2023-04-30 MED ORDER — SERTRALINE HCL 100 MG PO TABS
100.0000 mg | ORAL_TABLET | Freq: Every day | ORAL | 1 refills | Status: DC
  Filled 2023-04-30: qty 90, 90d supply, fill #0
  Filled 2023-07-29: qty 90, 90d supply, fill #1

## 2023-05-05 ENCOUNTER — Other Ambulatory Visit: Payer: Self-pay

## 2023-05-05 ENCOUNTER — Other Ambulatory Visit (HOSPITAL_COMMUNITY): Payer: Self-pay

## 2023-05-05 ENCOUNTER — Encounter: Payer: Self-pay | Admitting: Gastroenterology

## 2023-05-05 MED ORDER — OXYCODONE HCL 20 MG PO TABS
20.0000 mg | ORAL_TABLET | Freq: Four times a day (QID) | ORAL | 0 refills | Status: AC | PRN
  Filled 2023-05-05 – 2023-05-09 (×2): qty 120, 30d supply, fill #0

## 2023-05-05 MED ORDER — BUPRENORPHINE HCL 8 MG SL SUBL
8.0000 mg | SUBLINGUAL_TABLET | Freq: Two times a day (BID) | SUBLINGUAL | 0 refills | Status: DC
  Filled 2023-05-05 – 2023-05-19 (×2): qty 60, 30d supply, fill #0

## 2023-05-05 MED ORDER — DIAZEPAM 5 MG PO TABS
5.0000 mg | ORAL_TABLET | Freq: Two times a day (BID) | ORAL | 0 refills | Status: DC | PRN
  Filled 2023-05-05 – 2023-05-12 (×2): qty 60, 30d supply, fill #0

## 2023-05-05 MED ORDER — NALOXONE HCL 4 MG/0.1ML NA LIQD
4.0000 mg | NASAL | 1 refills | Status: AC | PRN
Start: 2023-05-05 — End: ?
  Filled 2023-05-05: qty 2, 30d supply, fill #0

## 2023-05-06 ENCOUNTER — Other Ambulatory Visit (HOSPITAL_COMMUNITY): Payer: Self-pay

## 2023-05-06 MED ORDER — ARIPIPRAZOLE 5 MG PO TABS
5.0000 mg | ORAL_TABLET | Freq: Every day | ORAL | 1 refills | Status: DC
  Filled 2023-05-06: qty 90, 90d supply, fill #0
  Filled 2023-08-01: qty 90, 90d supply, fill #1

## 2023-05-08 ENCOUNTER — Other Ambulatory Visit (HOSPITAL_COMMUNITY): Payer: Self-pay

## 2023-05-08 ENCOUNTER — Ambulatory Visit (AMBULATORY_SURGERY_CENTER)

## 2023-05-08 ENCOUNTER — Encounter: Payer: Self-pay | Admitting: Gastroenterology

## 2023-05-08 ENCOUNTER — Other Ambulatory Visit (HOSPITAL_BASED_OUTPATIENT_CLINIC_OR_DEPARTMENT_OTHER): Payer: Self-pay

## 2023-05-08 VITALS — Ht 69.0 in | Wt 228.0 lb

## 2023-05-08 DIAGNOSIS — Z860100 Personal history of colon polyps, unspecified: Secondary | ICD-10-CM

## 2023-05-08 MED ORDER — PEG 3350-KCL-NA BICARB-NACL 420 G PO SOLR
4000.0000 mL | Freq: Once | ORAL | 0 refills | Status: AC
Start: 2023-05-08 — End: 2023-05-10
  Filled 2023-05-08: qty 4000, 1d supply, fill #0

## 2023-05-08 NOTE — Progress Notes (Signed)
 No egg or soy allergy known to patient  issues known to pt with past sedation with any surgeries or procedures(combative when wakening) Patient denies ever being told they had issues or difficulty with intubation  No FH of Malignant Hyperthermia Pt is not on diet pills Pt is not on  home 02  Pt is not on blood thinners  Pt has occasional issues with constipation , 2 day prep ordered No A fib or A flutter Have any cardiac testing pending--No Pt can ambulate  Pt uses of chewing tobacco Discussed diabetic I weight loss medication holds Discussed NSAID holds Checked BMI Pt instructed to use Single[employer] or GoodRx for a price reduction on prep  Pre visit completed

## 2023-05-09 ENCOUNTER — Other Ambulatory Visit (HOSPITAL_COMMUNITY): Payer: Self-pay

## 2023-05-12 ENCOUNTER — Other Ambulatory Visit (HOSPITAL_COMMUNITY): Payer: Self-pay

## 2023-05-13 ENCOUNTER — Telehealth: Payer: Self-pay | Admitting: Gastroenterology

## 2023-05-13 ENCOUNTER — Encounter: Payer: Self-pay | Admitting: Certified Registered Nurse Anesthetist

## 2023-05-13 NOTE — Telephone Encounter (Signed)
 Inbound call from patient requesting a call back from nurse. States he has questions about prep instructions. Please advise, thank you.

## 2023-05-13 NOTE — Telephone Encounter (Signed)
 Spoke with patient. All questions answered.

## 2023-05-15 ENCOUNTER — Encounter: Payer: Self-pay | Admitting: Gastroenterology

## 2023-05-15 ENCOUNTER — Ambulatory Visit (AMBULATORY_SURGERY_CENTER): Admitting: Gastroenterology

## 2023-05-15 VITALS — BP 125/87 | HR 66 | Temp 98.4°F | Resp 11 | Ht 69.0 in | Wt 228.0 lb

## 2023-05-15 DIAGNOSIS — K635 Polyp of colon: Secondary | ICD-10-CM | POA: Diagnosis not present

## 2023-05-15 DIAGNOSIS — Z1211 Encounter for screening for malignant neoplasm of colon: Secondary | ICD-10-CM | POA: Diagnosis present

## 2023-05-15 DIAGNOSIS — D125 Benign neoplasm of sigmoid colon: Secondary | ICD-10-CM

## 2023-05-15 DIAGNOSIS — D123 Benign neoplasm of transverse colon: Secondary | ICD-10-CM

## 2023-05-15 DIAGNOSIS — D122 Benign neoplasm of ascending colon: Secondary | ICD-10-CM | POA: Diagnosis not present

## 2023-05-15 DIAGNOSIS — K648 Other hemorrhoids: Secondary | ICD-10-CM | POA: Diagnosis not present

## 2023-05-15 DIAGNOSIS — K5730 Diverticulosis of large intestine without perforation or abscess without bleeding: Secondary | ICD-10-CM | POA: Diagnosis not present

## 2023-05-15 DIAGNOSIS — Z800 Family history of malignant neoplasm of digestive organs: Secondary | ICD-10-CM

## 2023-05-15 DIAGNOSIS — Z860101 Personal history of adenomatous and serrated colon polyps: Secondary | ICD-10-CM

## 2023-05-15 DIAGNOSIS — Z860100 Personal history of colon polyps, unspecified: Secondary | ICD-10-CM

## 2023-05-15 MED ORDER — SODIUM CHLORIDE 0.9 % IV SOLN
500.0000 mL | Freq: Once | INTRAVENOUS | Status: DC
Start: 2023-05-15 — End: 2023-05-15

## 2023-05-15 NOTE — Progress Notes (Signed)
 Pt's states no medical or surgical changes since previsit or office visit.

## 2023-05-15 NOTE — Progress Notes (Signed)
 Report given to PACU, vss

## 2023-05-15 NOTE — Op Note (Signed)
 [facility] Patient Name: Robert Wilkerson Procedure Date: 05/15/2023 7:47 AM MRN: [id number] Endoscopist: Viviann Spare P. Adela Lank , MD, [id number] Age: 58 Referring MD:  Date of Birth: [birth year] Gender: Male Account #: [id number] Procedure:                Colonoscopy Indications:              High risk colon cancer surveillance: Personal                            history of colonic polyps - numerous polyps removed                            05/2020 including one advanced lesion, brother and                            mother had CRC dx around age 44 Medicines:                Monitored Anesthesia Care Procedure:                Pre-Anesthesia Assessment:                           - Prior to the procedure, a History and Physical                            was performed, and patient medications and                            allergies were reviewed. The patient's tolerance of                            previous anesthesia was also reviewed. The risks                            and benefits of the procedure and the sedation                            options and risks were discussed with the patient.                            All questions were answered, and informed consent                            was obtained. Prior Anticoagulants: The patient has                            taken no anticoagulant or antiplatelet agents. ASA                            Grade Assessment: II - A patient with mild systemic                            disease. After reviewing the risks and benefits,  the patient was deemed in satisfactory condition to                            undergo the procedure.                           After obtaining informed consent, the colonoscope                            was passed under direct vision. Throughout the                            procedure, the patient's blood pressure, pulse, and                            oxygen saturations were  monitored continuously. The                            Olympus Scope SN: [id number] was introduced through                            the anus and advanced to the the cecum, identified                            by appendiceal orifice and ileocecal valve. The                            colonoscopy was performed without difficulty. The                            patient tolerated the procedure well. The quality                            of the bowel preparation was adequate. The                            ileocecal valve, appendiceal orifice, and rectum                            were photographed. Scope In: 7:58:30 AM Scope Out: 8:23:19 AM Scope Withdrawal Time: 0 hours 20 minutes 58 seconds  Total Procedure Duration: 0 hours 24 minutes 49 seconds  Findings:                 The perianal and digital rectal examinations were                            normal.                           A few small-mouthed diverticula were found in the                            sigmoid colon.  A diminutive polyp was found in the ascending                            colon. The polyp was flat. The polyp was removed                            with a cold snare. Resection and retrieval were                            complete.                           A 5 mm polyp was found in the transverse colon. The                            polyp was flat. The polyp was removed with a cold                            snare. Resection and retrieval were complete.                           A 4 mm polyp was found in the splenic flexure. The                            polyp was sessile. The polyp was removed with a                            cold snare. Resection and retrieval were complete.                           A diminutive polyp was found in the sigmoid colon.                            The polyp was sessile. The polyp was removed with a                            cold snare. Resection was  complete but polyp was                            not retrieved.                           Internal hemorrhoids were found during                            retroflexion. The hemorrhoids were small.                           The exam was otherwise without abnormality. Complications:            No immediate complications. Estimated blood loss:                            Minimal. Estimated  Blood Loss:     Estimated blood loss was minimal. Impression:               - Diverticulosis in the sigmoid colon.                           - One diminutive polyp in the ascending colon,                            removed with a cold snare. Resected and retrieved.                           - One 5 mm polyp in the transverse colon, removed                            with a cold snare. Resected and retrieved.                           - One 4 mm polyp at the splenic flexure, removed                            with a cold snare. Resected and retrieved.                           - One diminutive polyp in the sigmoid colon,                            removed with a cold snare. Resected.                           - Internal hemorrhoids.                           - The examination was otherwise normal. Recommendation:           - Patient has a contact number available for                            emergencies. The signs and symptoms of potential                            delayed complications were discussed with the                            patient. Return to normal activities tomorrow.                            Written discharge instructions were provided to the                            patient.                           - Resume previous diet.                           -  Continue present medications.                           - Await pathology results. Consideration for                            genetic testing if not already done given polyps                            removed in the past and strong  family history of                            colon cancer, will discuss with the patient Willaim Rayas. Aarik Blank, MD 05/15/2023 8:28:56 AM This report has been signed electronically.

## 2023-05-15 NOTE — Patient Instructions (Signed)
 Resume previous medications and diet.  Handouts provided on diverticulosis, polyps, and hemorrhoids.  Follow up colonoscopy based on biopsy results.    YOU HAD AN ENDOSCOPIC PROCEDURE TODAY AT THE [facility]:   Refer to the procedure report that was given to you for any specific questions about what was found during the examination.  If the procedure report does not answer your questions, please call your gastroenterologist to clarify.  If you requested that your care partner not be given the details of your procedure findings, then the procedure report has been included in a sealed envelope for you to review at your convenience later.  YOU SHOULD EXPECT: Some feelings of bloating in the abdomen. Passage of more gas than usual.  Walking can help get rid of the air that was put into your GI tract during the procedure and reduce the bloating. If you had a lower endoscopy (such as a colonoscopy or flexible sigmoidoscopy) you may notice spotting of blood in your stool or on the toilet paper. If you underwent a bowel prep for your procedure, you may not have a normal bowel movement for a few days.  Please Note:  You might notice some irritation and congestion in your nose or some drainage.  This is from the oxygen used during your procedure.  There is no need for concern and it should clear up in a day or so.  SYMPTOMS TO REPORT IMMEDIATELY:  Following lower endoscopy (colonoscopy or flexible sigmoidoscopy):  Excessive amounts of blood in the stool  Significant tenderness or worsening of abdominal pains  Swelling of the abdomen that is new, acute  Fever of 100F or higher  For urgent or emergent issues, a gastroenterologist can be reached at any hour by calling [phone]. Do not use MyChart messaging for urgent concerns.    DIET:  We do recommend a small meal at first, but then you may proceed to your regular diet.  Drink plenty of fluids but you should avoid alcoholic beverages for  24 hours.  ACTIVITY:  You should plan to take it easy for the rest of today and you should NOT DRIVE or use heavy machinery until tomorrow (because of the sedation medicines used during the test).    FOLLOW UP: Our staff will call the number listed on your records the next business day following your procedure.  We will call around 7:15- 8:00 am to check on you and address any questions or concerns that you may have regarding the information given to you following your procedure. If we do not reach you, we will leave a message.     If any biopsies were taken you will be contacted by phone or by letter within the next 1-3 weeks.  Please call us at [phone] if you have not heard about the biopsies in 3 weeks.    SIGNATURES/CONFIDENTIALITY: You and/or your care partner have signed paperwork which will be entered into your electronic medical record.  These signatures attest to the fact that that the information above on your After Visit Summary has been reviewed and is understood.  Full responsibility of the confidentiality of this discharge information lies with you and/or your care-partner.

## 2023-05-15 NOTE — Progress Notes (Signed)
 [facility] History and Physical   Primary Care Physician:  Patient, No Pcp Per   Reason for Procedure:   History of colon polyps  Plan:    colonoscopy     HPI: Robert Wilkerson is a 58 y.o. male  here for colonoscopy surveillance - last exam 05/2020 - 9 polyps removed, once advanced. Mother and brother had CRC around age 60.   Patient denies any bowel symptoms at this time. Otherwise feels well without any cardiopulmonary symptoms.   I have discussed risks / benefits of anesthesia and endoscopic procedure with [name] and they wish to proceed with the exams as outlined today.    Past Medical History:  Diagnosis Date   Allergy    Anxiety    Arthritis    Asthma    Back pain    Cancer (HCC)    skin cancer    Depression    Diabetes mellitus without complication (HCC)    GERD (gastroesophageal reflux disease)    Gout    Hyperlipidemia    Hypertension    Mitral valve prolapse    Seizures (HCC)     Past Surgical History:  Procedure Laterality Date   BACK SURGERY     x2   COLONOSCOPY     2018   KNEE SURGERY     POLYPECTOMY     SHOULDER SURGERY Right    x2    Prior to Admission medications   Medication Sig Start Date End Date Taking? Authorizing Provider  allopurinol (ZYLOPRIM) 100 MG tablet Take 100 mg by mouth daily. 04/24/20  Yes [provider]  allopurinol (ZYLOPRIM) 100 MG tablet Take 1 tablet (100 mg total) by mouth daily. 10/18/22  Yes   ARIPiprazole (ABILIFY) 5 MG tablet Take 1 tablet (5 mg total) by mouth daily. 05/06/23  Yes   buprenorphine (SUBUTEX) 8 MG SUBL SL tablet Place 1 tablet (8 mg total) under the tongue 2 (two) times daily. 05/05/23  Yes   diazepam (VALIUM) 5 MG tablet Take 1 tablet (5 mg total) by mouth 2 (two) times daily as needed. 10/02/22  Yes   fluticasone (FLONASE) 50 MCG/ACT nasal spray Place 2 sprays into both nostrils daily.   Yes [provider]  losartan (COZAAR) 25 MG tablet Take 25 mg by mouth daily.  03/23/20  Yes [provider]  losartan (COZAAR) 25 MG tablet Take 1 tablet (25 mg total) by mouth daily for kidney protection. 04/30/23  Yes   metFORMIN (GLUCOPHAGE) 500 MG tablet Take 500 mg by mouth 2 (two) times daily with a meal.   Yes [provider]  Multiple Vitamins-Minerals (MULTIVITAMINS THER. W/MINERALS) TABS Take 1 tablet by mouth daily.   Yes [provider]  Oxycodone HCl 20 MG TABS Take 1 tablet by mouth four times daily as needed. 12/21/20  Yes   pravastatin (PRAVACHOL) 10 MG tablet Take 10 mg by mouth at bedtime. 04/14/20  Yes [provider]  sertraline (ZOLOFT) 100 MG tablet Take 100 mg by mouth daily.   Yes [provider]  albuterol (VENTOLIN HFA) 108 (90 Base) MCG/ACT inhaler Inhale 1-2 puffs into the lungs every 6 (six) hours as needed for wheezing or shortness of breath.    [provider]  allopurinol (ZYLOPRIM) 100 MG tablet TAKE 1 TABLET BY MOUTH DAILY 03/23/20 03/23/21  Redmon, Altamease Oiler, PA  allopurinol (ZYLOPRIM) 100 MG tablet TAKE 1 TABLET BY MOUTH ONCE DAILY 12/06/19 12/05/20  Redmon, Altamease Oiler, PA  ARIPiprazole (ABILIFY) 5  MG tablet Take 5 mg by mouth daily. Patient not taking: Reported on 05/15/2023    [provider]  ARIPiprazole (ABILIFY) 5 MG tablet TAKE 1 TABLET BY MOUTH ONCE DAILY Patient taking differently: Take 5 mg by mouth daily. 10/14/19 05/08/23  Redmon, Altamease Oiler, PA  cephALEXin (KEFLEX) 500 MG capsule Take 2 capsules by mouth 2 times a day Patient not taking: Reported on 05/15/2023 08/21/21     diazepam (VALIUM) 2 MG tablet Take 1 tablet by mouth two times daily as needed. Patient not taking: Reported on 05/15/2023 12/21/20     diazepam (VALIUM) 2 MG tablet Take 1 tablet (2 mg total) by mouth 2 (two) times daily as needed. Patient not taking: Reported on 05/15/2023 01/22/21     diazepam (VALIUM) 2 MG tablet Take 1 tablet by mouth two times daily as needed Patient not taking: Reported on 05/15/2023 03/23/21      diazepam (VALIUM) 2 MG tablet Take 1 tablet (2 mg total) by mouth 2 (two) times daily as needed. Patient not taking: Reported on 05/15/2023 03/24/21     diazepam (VALIUM) 5 MG tablet Take 1 (one) tablet by mouth two times daily as needed Patient not taking: Reported on 05/15/2023 07/23/21     diazepam (VALIUM) 5 MG tablet Take 1 tablet (5 mg total) by mouth two times daily as needed. Patient not taking: Reported on 05/15/2023 08/17/21     diazepam (VALIUM) 5 MG tablet Take 1 tablet by mouth 2 times daily as needed. Patient not taking: Reported on 05/15/2023 09/14/21     diazepam (VALIUM) 5 MG tablet Take 1 tablet (5 mg total) by mouth 2 (two) times daily as needed. Patient not taking: Reported on 05/15/2023 10/12/21     diazepam (VALIUM) 5 MG tablet Take 1 (one) tablet by mouth two times daily, as needed Patient not taking: Reported on 05/15/2023 11/09/21     diazepam (VALIUM) 5 MG tablet Take 1 tablet (5 mg total) by mouth 2 (two) times daily as needed. Patient not taking: Reported on 05/15/2023 03/11/22     diazepam (VALIUM) 5 MG tablet Take 1 tablet (5 mg) by mouth 2 times daily as needed. (fill 09/11/22) Patient not taking: Reported on 05/15/2023 09/03/22     diazepam (VALIUM) 5 MG tablet Take 1 tablet (5 mg total) by mouth 2 (two) times daily as needed. Patient not taking: Reported on 05/15/2023 10/30/22     diazepam (VALIUM) 5 MG tablet Take 1 tablet (5 mg total) by mouth 2 (two) times daily as needed. Patient not taking: Reported on 05/15/2023 12/11/22     diazepam (VALIUM) 5 MG tablet Take 1 tablet (5 mg total) by mouth 2 (two) times daily as needed. Patient not taking: Reported on 05/15/2023 01/10/23     diazepam (VALIUM) 5 MG tablet Take 1 tablet (5 mg total) by mouth 2 (two) times daily as needed (fill 02/09/23) Patient not taking: Reported on 05/15/2023 02/04/23     diazepam (VALIUM) 5 MG tablet Take 1 tablet (5 mg total) by mouth 2 (two) times daily as needed. Patient not taking: Reported on 05/15/2023  05/05/23     fluticasone-salmeterol (ADVAIR) 100-50 MCG/ACT AEPB Inhale 1 puff by mouth into the lungs twice a day Patient not taking: Reported on 05/15/2023 12/14/20     fluticasone-salmeterol (ADVAIR) 100-50 MCG/ACT AEPB Inhale 1 puff into the lungs 2 (two) times daily. Patient not taking: Reported on 05/15/2023 07/31/22     fluticasone-salmeterol (ADVAIR) 100-50 MCG/ACT AEPB Inhale 1 puff  into the lungs 2 (two) times daily. Patient not taking: Reported on 05/15/2023 02/20/23     Fluticasone-Salmeterol (ADVAIR) 100-50 MCG/DOSE AEPB 1 puff Patient not taking: Reported on 05/15/2023    [provider]  indomethacin (INDOCIN) 50 MG capsule Take 1 capsule (50 mg total) by mouth 3 (three) times daily with food as needed for gout Patient not taking: Reported on 05/15/2023 09/06/20     metFORMIN (GLUCOPHAGE) 1000 MG tablet Take 1 tablet (1,000 mg total) by mouth 2 (two) times daily with a meal. Patient not taking: Reported on 05/15/2023 10/08/22     naloxone (NARCAN) nasal spray 4 mg/0.1 mL Place 1 spray into the nostil  as needed, IF FOUND UNRESPONSIVE THEN SPRAY INTO NOSE AND CALL 911 IMMEDIATELY 07/20/21     naloxone (NARCAN) nasal spray 4 mg/0.1 mL 1 (one) spray, non-aerosol as needed, IF FOUND UNRESPONSIVE THEN SPRAY INTO NOSE AND CALL 911 IMMEDIATELY 08/17/21     naloxone (NARCAN) nasal spray 4 mg/0.1 mL Use 1 spray as needed, IF FOUND UNRESPONSIVE THEN SPRAY INTO NOSE AND CALL 911 IMMEDIATELY 12/30/22     naloxone (NARCAN) nasal spray 4 mg/0.1 mL Place 1 spray (4 mg total) into the nose as needed if found unresponsive then spray into nose and call 911 immediately 05/05/23     naproxen sodium (ANAPROX) 220 MG tablet Take 220 mg by mouth daily as needed (for pain).  Patient not taking: Reported on 05/05/2020    [provider]  omeprazole (PRILOSEC OTC) 20 MG tablet Take 20 mg by mouth daily. Patient not taking: Reported on 05/15/2023    [provider]  oxyCODONE (OXYCONTIN) 10 mg 12 hr  tablet Take 1 tablet by mouth 2 times a day Fill on 09/13/20 Patient not taking: Reported on 05/15/2023 09/12/20     oxyCODONE (ROXICODONE) 15 MG immediate release tablet Take 15 mg by mouth 5 (five) times daily as needed. Patient not taking: Reported on 05/15/2023 03/23/20   [provider]  Oxycodone HCl 20 MG TABS Take 1 tablet (20 mg total) by mouth 4 (four) times daily as needed. Patient not taking: Reported on 05/15/2023 01/22/21     Oxycodone HCl 20 MG TABS Take 1 (one) Tablet by mouth four times daily, as needed Patient not taking: Reported on 05/15/2023 07/20/21     Oxycodone HCl 20 MG TABS Take 1 tablet (20 mg total) by mouth 4 (four) times daily as needed. Patient not taking: Reported on 05/15/2023 08/17/21     Oxycodone HCl 20 MG TABS Take 1 tablet (20 mg total) by mouth 4 (four) times daily as needed. Patient not taking: Reported on 05/15/2023 09/14/21     Oxycodone HCl 20 MG TABS Take 1 tablet (20 mg total) by mouth 4 (four) times daily as needed. Patient not taking: Reported on 05/15/2023 03/11/22     Oxycodone HCl 20 MG TABS Take 1 tablet by mouth 4 times daily, as needed Patient not taking: Reported on 05/15/2023 04/08/22     Oxycodone HCl 20 MG TABS Take 1 tablet (20 mg) by mouth 4 times daily as needed. Patient not taking: Reported on 05/15/2023 09/03/22     Oxycodone HCl 20 MG TABS Take 1 tablet (20 mg total) by mouth 4 (four) times daily as needed. Patient not taking: Reported on 05/15/2023 10/02/22     Oxycodone HCl 20 MG TABS Take 1 tablet (20 mg total) by mouth 4 (four) times daily as needed. Patient not taking: Reported on 05/15/2023 10/30/22  Oxycodone HCl 20 MG TABS Take 1 tablet (20 mg total) by mouth 4 (four) times daily as needed. Patient not taking: Reported on 05/15/2023 12/08/22     Oxycodone HCl 20 MG TABS Take 1 tablet (20 mg total) by mouth 4 (four) times daily as needed. 01/08/23 Patient not taking: Reported on 05/15/2023 01/08/23     Oxycodone HCl 20 MG TABS Take 1  tablet (20 mg total) by mouth 4 (four) times daily as needed Patient not taking: Reported on 05/15/2023 02/04/23     Oxycodone HCl 20 MG TABS Take 1 tablet (20 mg total) by mouth in the morning, at noon, in the evening, and at bedtime as needed. Patient not taking: Reported on 05/15/2023 03/07/23     Oxycodone HCl 20 MG TABS Take 1 tablet (20 mg total) by mouth 4 (four) times daily as needed. Patient not taking: Reported on 05/15/2023 05/05/23     pravastatin (PRAVACHOL) 10 MG tablet TAKE 1 TABLET BY MOUTH ONCE DAILY 10/14/19 10/13/20  Redmon, Altamease Oiler, PA  pravastatin (PRAVACHOL) 10 MG tablet TAKE 1 TABLET BY MOUTH ONCE A DAY Patient not taking: Reported on 05/15/2023 08/12/22     pravastatin (PRAVACHOL) 10 MG tablet TAKE 1 TABLET BY MOUTH ONCE A DAY Patient not taking: Reported on 05/15/2023 02/20/23     sertraline (ZOLOFT) 100 MG tablet TAKE 1 AND 1/2 TABLETS BY MOUTH ONCE DAILY 10/14/19 10/13/20  Redmon, Altamease Oiler, PA  sertraline (ZOLOFT) 100 MG tablet Take 1 tablet (100 mg total) by mouth daily. Patient not taking: Reported on 05/15/2023 04/30/23       Current Outpatient Medications  Medication Sig Dispense Refill   allopurinol (ZYLOPRIM) 100 MG tablet Take 100 mg by mouth daily.     allopurinol (ZYLOPRIM) 100 MG tablet Take 1 tablet (100 mg total) by mouth daily. 90 tablet 2   ARIPiprazole (ABILIFY) 5 MG tablet Take 1 tablet (5 mg total) by mouth daily. 90 tablet 1   buprenorphine (SUBUTEX) 8 MG SUBL SL tablet Place 1 tablet (8 mg total) under the tongue 2 (two) times daily. 60 tablet 0   diazepam (VALIUM) 5 MG tablet Take 1 tablet (5 mg total) by mouth 2 (two) times daily as needed. 60 tablet 0   fluticasone (FLONASE) 50 MCG/ACT nasal spray Place 2 sprays into both nostrils daily.     losartan (COZAAR) 25 MG tablet Take 25 mg by mouth daily.     losartan (COZAAR) 25 MG tablet Take 1 tablet (25 mg total) by mouth daily for kidney protection. 90 tablet 1   metFORMIN (GLUCOPHAGE) 500 MG tablet Take 500 mg by  mouth 2 (two) times daily with a meal.     Multiple Vitamins-Minerals (MULTIVITAMINS THER. W/MINERALS) TABS Take 1 tablet by mouth daily.     Oxycodone HCl 20 MG TABS Take 1 tablet by mouth four times daily as needed. 120 tablet 0   pravastatin (PRAVACHOL) 10 MG tablet Take 10 mg by mouth at bedtime.     sertraline (ZOLOFT) 100 MG tablet Take 100 mg by mouth daily.     albuterol (VENTOLIN HFA) 108 (90 Base) MCG/ACT inhaler Inhale 1-2 puffs into the lungs every 6 (six) hours as needed for wheezing or shortness of breath.     allopurinol (ZYLOPRIM) 100 MG tablet TAKE 1 TABLET BY MOUTH DAILY 30 tablet 1   allopurinol (ZYLOPRIM) 100 MG tablet TAKE 1 TABLET BY MOUTH ONCE DAILY 30 tablet 2   ARIPiprazole (ABILIFY) 5 MG tablet Take 5 mg  by mouth daily. (Patient not taking: Reported on 05/15/2023)     ARIPiprazole (ABILIFY) 5 MG tablet TAKE 1 TABLET BY MOUTH ONCE DAILY (Patient taking differently: Take 5 mg by mouth daily.) 90 tablet 1   cephALEXin (KEFLEX) 500 MG capsule Take 2 capsules by mouth 2 times a day (Patient not taking: Reported on 05/15/2023) 20 capsule 0   diazepam (VALIUM) 2 MG tablet Take 1 tablet by mouth two times daily as needed. (Patient not taking: Reported on 05/15/2023) 60 tablet 0   diazepam (VALIUM) 2 MG tablet Take 1 tablet (2 mg total) by mouth 2 (two) times daily as needed. (Patient not taking: Reported on 05/15/2023) 60 tablet 0   diazepam (VALIUM) 2 MG tablet Take 1 tablet by mouth two times daily as needed (Patient not taking: Reported on 05/15/2023) 60 tablet 0   diazepam (VALIUM) 2 MG tablet Take 1 tablet (2 mg total) by mouth 2 (two) times daily as needed. (Patient not taking: Reported on 05/15/2023) 60 tablet 0   diazepam (VALIUM) 5 MG tablet Take 1 (one) tablet by mouth two times daily as needed (Patient not taking: Reported on 05/15/2023) 60 tablet 0   diazepam (VALIUM) 5 MG tablet Take 1 tablet (5 mg total) by mouth two times daily as needed. (Patient not taking: Reported on  05/15/2023) 60 tablet 0   diazepam (VALIUM) 5 MG tablet Take 1 tablet by mouth 2 times daily as needed. (Patient not taking: Reported on 05/15/2023) 60 tablet 0   diazepam (VALIUM) 5 MG tablet Take 1 tablet (5 mg total) by mouth 2 (two) times daily as needed. (Patient not taking: Reported on 05/15/2023) 60 tablet 0   diazepam (VALIUM) 5 MG tablet Take 1 (one) tablet by mouth two times daily, as needed (Patient not taking: Reported on 05/15/2023) 60 tablet 0   diazepam (VALIUM) 5 MG tablet Take 1 tablet (5 mg total) by mouth 2 (two) times daily as needed. (Patient not taking: Reported on 05/15/2023) 60 tablet 0   diazepam (VALIUM) 5 MG tablet Take 1 tablet (5 mg) by mouth 2 times daily as needed. (fill 09/11/22) (Patient not taking: Reported on 05/15/2023) 60 tablet 0   diazepam (VALIUM) 5 MG tablet Take 1 tablet (5 mg total) by mouth 2 (two) times daily as needed. (Patient not taking: Reported on 05/15/2023) 60 tablet 0   diazepam (VALIUM) 5 MG tablet Take 1 tablet (5 mg total) by mouth 2 (two) times daily as needed. (Patient not taking: Reported on 05/15/2023) 60 tablet 0   diazepam (VALIUM) 5 MG tablet Take 1 tablet (5 mg total) by mouth 2 (two) times daily as needed. (Patient not taking: Reported on 05/15/2023) 60 tablet 0   diazepam (VALIUM) 5 MG tablet Take 1 tablet (5 mg total) by mouth 2 (two) times daily as needed (fill 02/09/23) (Patient not taking: Reported on 05/15/2023) 60 tablet 0   diazepam (VALIUM) 5 MG tablet Take 1 tablet (5 mg total) by mouth 2 (two) times daily as needed. (Patient not taking: Reported on 05/15/2023) 60 tablet 0   fluticasone-salmeterol (ADVAIR) 100-50 MCG/ACT AEPB Inhale 1 puff by mouth into the lungs twice a day (Patient not taking: Reported on 05/15/2023) 180 each 3   fluticasone-salmeterol (ADVAIR) 100-50 MCG/ACT AEPB Inhale 1 puff into the lungs 2 (two) times daily. (Patient not taking: Reported on 05/15/2023) 180 each 3   fluticasone-salmeterol (ADVAIR) 100-50 MCG/ACT AEPB  Inhale 1 puff into the lungs 2 (two) times daily. (Patient not taking:  Reported on 05/15/2023) 180 each 3   Fluticasone-Salmeterol (ADVAIR) 100-50 MCG/DOSE AEPB 1 puff (Patient not taking: Reported on 05/15/2023)     indomethacin (INDOCIN) 50 MG capsule Take 1 capsule (50 mg total) by mouth 3 (three) times daily with food as needed for gout (Patient not taking: Reported on 05/15/2023) 20 capsule 0   metFORMIN (GLUCOPHAGE) 1000 MG tablet Take 1 tablet (1,000 mg total) by mouth 2 (two) times daily with a meal. (Patient not taking: Reported on 05/15/2023) 180 tablet 1   naloxone (NARCAN) nasal spray 4 mg/0.1 mL Place 1 spray into the nostil  as needed, IF FOUND UNRESPONSIVE THEN SPRAY INTO NOSE AND CALL 911 IMMEDIATELY 2 each 1   naloxone (NARCAN) nasal spray 4 mg/0.1 mL 1 (one) spray, non-aerosol as needed, IF FOUND UNRESPONSIVE THEN SPRAY INTO NOSE AND CALL 911 IMMEDIATELY 1 each 1   naloxone (NARCAN) nasal spray 4 mg/0.1 mL Use 1 spray as needed, IF FOUND UNRESPONSIVE THEN SPRAY INTO NOSE AND CALL 911 IMMEDIATELY 2 each 1   naloxone (NARCAN) nasal spray 4 mg/0.1 mL Place 1 spray (4 mg total) into the nose as needed if found unresponsive then spray into nose and call 911 immediately 1 each 1   naproxen sodium (ANAPROX) 220 MG tablet Take 220 mg by mouth daily as needed (for pain).  (Patient not taking: Reported on 05/05/2020)     omeprazole (PRILOSEC OTC) 20 MG tablet Take 20 mg by mouth daily. (Patient not taking: Reported on 05/15/2023)     oxyCODONE (OXYCONTIN) 10 mg 12 hr tablet Take 1 tablet by mouth 2 times a day Fill on 09/13/20 (Patient not taking: Reported on 05/15/2023) 60 tablet 0   oxyCODONE (ROXICODONE) 15 MG immediate release tablet Take 15 mg by mouth 5 (five) times daily as needed. (Patient not taking: Reported on 05/15/2023)     Oxycodone HCl 20 MG TABS Take 1 tablet (20 mg total) by mouth 4 (four) times daily as needed. (Patient not taking: Reported on 05/15/2023) 120 tablet 0   Oxycodone HCl 20  MG TABS Take 1 (one) Tablet by mouth four times daily, as needed (Patient not taking: Reported on 05/15/2023) 120 tablet 0   Oxycodone HCl 20 MG TABS Take 1 tablet (20 mg total) by mouth 4 (four) times daily as needed. (Patient not taking: Reported on 05/15/2023) 112 tablet 0   Oxycodone HCl 20 MG TABS Take 1 tablet (20 mg total) by mouth 4 (four) times daily as needed. (Patient not taking: Reported on 05/15/2023) 112 tablet 0   Oxycodone HCl 20 MG TABS Take 1 tablet (20 mg total) by mouth 4 (four) times daily as needed. (Patient not taking: Reported on 05/15/2023) 120 tablet 0   Oxycodone HCl 20 MG TABS Take 1 tablet by mouth 4 times daily, as needed (Patient not taking: Reported on 05/15/2023) 120 tablet 0   Oxycodone HCl 20 MG TABS Take 1 tablet (20 mg) by mouth 4 times daily as needed. (Patient not taking: Reported on 05/15/2023) 120 tablet 0   Oxycodone HCl 20 MG TABS Take 1 tablet (20 mg total) by mouth 4 (four) times daily as needed. (Patient not taking: Reported on 05/15/2023) 120 tablet 0   Oxycodone HCl 20 MG TABS Take 1 tablet (20 mg total) by mouth 4 (four) times daily as needed. (Patient not taking: Reported on 05/15/2023) 120 tablet 0   Oxycodone HCl 20 MG TABS Take 1 tablet (20 mg total) by mouth 4 (four) times daily as  needed. (Patient not taking: Reported on 05/15/2023) 120 tablet 0   Oxycodone HCl 20 MG TABS Take 1 tablet (20 mg total) by mouth 4 (four) times daily as needed. 01/08/23 (Patient not taking: Reported on 05/15/2023) 120 tablet 0   Oxycodone HCl 20 MG TABS Take 1 tablet (20 mg total) by mouth 4 (four) times daily as needed (Patient not taking: Reported on 05/15/2023) 120 tablet 0   Oxycodone HCl 20 MG TABS Take 1 tablet (20 mg total) by mouth in the morning, at noon, in the evening, and at bedtime as needed. (Patient not taking: Reported on 05/15/2023) 120 tablet 0   Oxycodone HCl 20 MG TABS Take 1 tablet (20 mg total) by mouth 4 (four) times daily as needed. (Patient not taking:  Reported on 05/15/2023) 120 tablet 0   pravastatin (PRAVACHOL) 10 MG tablet TAKE 1 TABLET BY MOUTH ONCE DAILY 90 tablet 1   pravastatin (PRAVACHOL) 10 MG tablet TAKE 1 TABLET BY MOUTH ONCE A DAY (Patient not taking: Reported on 05/15/2023) 90 tablet 1   pravastatin (PRAVACHOL) 10 MG tablet TAKE 1 TABLET BY MOUTH ONCE A DAY (Patient not taking: Reported on 05/15/2023) 90 tablet 1   sertraline (ZOLOFT) 100 MG tablet TAKE 1 AND 1/2 TABLETS BY MOUTH ONCE DAILY 145 tablet 1   sertraline (ZOLOFT) 100 MG tablet Take 1 tablet (100 mg total) by mouth daily. (Patient not taking: Reported on 05/15/2023) 90 tablet 1   Current Facility-Administered Medications  Medication Dose Route Frequency Provider Last Rate Last Admin   0.9 %  sodium chloride infusion  500 mL Intravenous Once Lizeth Bencosme, Willaim Rayas, MD        Allergies as of 05/15/2023 - Review Complete 05/15/2023  Allergen Reaction Noted   Penicillins Hives 01/23/2011   Sulfa antibiotics Nausea And Vomiting and Swelling 01/23/2011    Family History  Problem Relation Age of Onset   Colon cancer Mother        in her 33's   Colon cancer Brother        in his 81's- he was a 9/11 [employer]- 9/11 relayed colon cancer    Colon cancer Maternal Grandfather    Leukemia Sister    Colon polyps Neg Hx    Esophageal cancer Neg Hx    Rectal cancer Neg Hx    Stomach cancer Neg Hx     Social History   Socioeconomic History   Marital status: Married    Spouse name: Not on file   Number of children: Not on file   Years of education: Not on file   Highest education level: Not on file  Occupational History   Not on file  Tobacco Use   Smoking status: Never   Smokeless tobacco: Current    Types: Snuff   Tobacco comments:    Instructed not to chew on the day of the procedure.  Vaping Use   Vaping status: Never Used  Substance and Sexual Activity   Alcohol use: Not Currently   Drug use: No   Sexual activity: Not on file  Other Topics Concern   Not on  file  Social History Narrative   Not on file   Social Drivers of Health   Financial Resource Strain: Not on file  Food Insecurity: Not on file  Transportation Needs: Not on file  Physical Activity: Not on file  Stress: Not on file  Social Connections: Not on file  Intimate Partner Violence: Not on file    Review of  Systems: All other review of systems negative except as mentioned in the HPI.  Physical Exam: Vital signs BP (!) 162/102   Pulse 72   Temp 98.4 F (36.9 C) (Temporal)   Resp 10   Ht 5\' 9"  (1.753 m)   Wt 228 lb (103.4 kg)   SpO2 100%   BMI 33.67 kg/m   General:   Alert,  Well-developed, pleasant and cooperative in NAD Lungs:  Clear throughout to auscultation.   Heart:  Regular rate and rhythm Abdomen:  Soft, nontender and nondistended.   Neuro/Psych:  Alert and cooperative. Normal mood and affect. A and O x 3  Harlin Rain, MD [facility]

## 2023-05-15 NOTE — Progress Notes (Signed)
 Called to room to assist during endoscopic procedure.  Patient ID and intended procedure confirmed with present staff. Received instructions for my participation in the procedure from the performing physician.

## 2023-05-16 ENCOUNTER — Telehealth: Payer: Self-pay | Admitting: *Deleted

## 2023-05-16 DIAGNOSIS — Z800 Family history of malignant neoplasm of digestive organs: Secondary | ICD-10-CM

## 2023-05-16 DIAGNOSIS — Z860100 Personal history of colon polyps, unspecified: Secondary | ICD-10-CM

## 2023-05-16 NOTE — Telephone Encounter (Signed)
 Genetics referral placed in EPIC.

## 2023-05-16 NOTE — Telephone Encounter (Signed)
  Follow up Call-     05/15/2023    7:17 AM  Call back number  Post procedure Call Back phone  # [phone]  Permission to leave phone message Yes     Patient questions:  Do you have a fever, pain , or abdominal swelling? No. Pain Score  0 *  Have you tolerated food without any problems? Yes.    Have you been able to return to your normal activities? Yes.    Do you have any questions about your discharge instructions: Diet   No. Medications  No. Follow up visit  No.  Do you have questions or concerns about your Care? No.  Actions: * If pain score is 4 or above: No action needed, pain <4.

## 2023-05-16 NOTE — Telephone Encounter (Signed)
-----   Message from Benancio Deeds sent at 05/15/2023  5:20 PM EDT ----- Regarding: referral to genetics Can someone please place a referral to genetic counselor for this patient for history of colon polyps and strong family history of colon cancer.  Thanks

## 2023-05-19 ENCOUNTER — Other Ambulatory Visit (HOSPITAL_COMMUNITY): Payer: Self-pay

## 2023-05-19 ENCOUNTER — Encounter: Payer: Self-pay | Admitting: Gastroenterology

## 2023-05-19 LAB — SURGICAL PATHOLOGY

## 2023-06-04 ENCOUNTER — Other Ambulatory Visit (HOSPITAL_COMMUNITY): Payer: Self-pay

## 2023-06-04 ENCOUNTER — Telehealth: Payer: Self-pay | Admitting: Genetic Counselor

## 2023-06-04 NOTE — Telephone Encounter (Signed)
 Scheduled appointment with pt with date and time, advised to arrive 10-15 minutes early and to bring insurance info.

## 2023-06-05 ENCOUNTER — Other Ambulatory Visit (HOSPITAL_COMMUNITY): Payer: Self-pay

## 2023-06-05 MED ORDER — DIAZEPAM 5 MG PO TABS
ORAL_TABLET | ORAL | 0 refills | Status: DC
  Filled 2023-06-11: qty 60, 30d supply, fill #0

## 2023-06-05 MED ORDER — OXYCODONE HCL 20 MG PO TABS
20.0000 mg | ORAL_TABLET | Freq: Four times a day (QID) | ORAL | 0 refills | Status: AC
  Filled 2023-06-09: qty 120, 30d supply, fill #0

## 2023-06-05 MED ORDER — BUPRENORPHINE HCL 8 MG SL SUBL
SUBLINGUAL_TABLET | SUBLINGUAL | 0 refills | Status: DC
  Filled 2023-06-18: qty 60, 30d supply, fill #0

## 2023-06-09 ENCOUNTER — Other Ambulatory Visit (HOSPITAL_COMMUNITY): Payer: Self-pay

## 2023-06-09 MED ORDER — METFORMIN HCL 1000 MG PO TABS
1000.0000 mg | ORAL_TABLET | Freq: Two times a day (BID) | ORAL | 1 refills | Status: DC
  Filled 2023-06-09: qty 180, 90d supply, fill #0
  Filled 2023-09-03: qty 180, 90d supply, fill #1

## 2023-06-11 ENCOUNTER — Other Ambulatory Visit (HOSPITAL_COMMUNITY): Payer: Self-pay

## 2023-06-18 ENCOUNTER — Other Ambulatory Visit (HOSPITAL_COMMUNITY): Payer: Self-pay

## 2023-07-04 ENCOUNTER — Other Ambulatory Visit (HOSPITAL_COMMUNITY): Payer: Self-pay

## 2023-07-04 MED ORDER — BUPRENORPHINE HCL 8 MG SL SUBL
8.0000 mg | SUBLINGUAL_TABLET | Freq: Two times a day (BID) | SUBLINGUAL | 0 refills | Status: DC
  Filled 2023-07-18: qty 60, 30d supply, fill #0

## 2023-07-04 MED ORDER — DIAZEPAM 5 MG PO TABS
5.0000 mg | ORAL_TABLET | Freq: Two times a day (BID) | ORAL | 0 refills | Status: DC | PRN
  Filled 2023-07-11: qty 60, 30d supply, fill #0

## 2023-07-04 MED ORDER — OXYCODONE HCL 20 MG PO TABS
20.0000 mg | ORAL_TABLET | Freq: Four times a day (QID) | ORAL | 0 refills | Status: DC | PRN
  Filled 2023-07-09: qty 120, 30d supply, fill #0

## 2023-07-07 ENCOUNTER — Other Ambulatory Visit (HOSPITAL_COMMUNITY): Payer: Self-pay

## 2023-07-08 ENCOUNTER — Other Ambulatory Visit (HOSPITAL_COMMUNITY): Payer: Self-pay

## 2023-07-09 ENCOUNTER — Other Ambulatory Visit (HOSPITAL_COMMUNITY): Payer: Self-pay

## 2023-07-10 ENCOUNTER — Other Ambulatory Visit

## 2023-07-10 ENCOUNTER — Encounter: Admitting: Genetic Counselor

## 2023-07-11 ENCOUNTER — Other Ambulatory Visit (HOSPITAL_COMMUNITY): Payer: Self-pay

## 2023-07-16 ENCOUNTER — Other Ambulatory Visit (HOSPITAL_COMMUNITY): Payer: Self-pay

## 2023-07-16 MED ORDER — FLUOROURACIL 5 % EX CREA
TOPICAL_CREAM | CUTANEOUS | 0 refills | Status: AC
Start: 2023-07-16 — End: ?
  Filled 2023-07-16: qty 40, 14d supply, fill #0

## 2023-07-17 ENCOUNTER — Inpatient Hospital Stay: Attending: Genetic Counselor | Admitting: Genetic Counselor

## 2023-07-17 ENCOUNTER — Inpatient Hospital Stay

## 2023-07-18 ENCOUNTER — Other Ambulatory Visit (HOSPITAL_COMMUNITY): Payer: Self-pay

## 2023-07-23 ENCOUNTER — Other Ambulatory Visit (HOSPITAL_COMMUNITY): Payer: Self-pay

## 2023-07-23 MED ORDER — ALLOPURINOL 100 MG PO TABS
100.0000 mg | ORAL_TABLET | Freq: Every day | ORAL | 2 refills | Status: AC
  Filled 2023-07-23: qty 90, 90d supply, fill #0
  Filled 2023-10-23: qty 90, 90d supply, fill #1
  Filled 2024-01-22: qty 90, 90d supply, fill #2

## 2023-07-29 ENCOUNTER — Other Ambulatory Visit (HOSPITAL_COMMUNITY): Payer: Self-pay

## 2023-08-01 ENCOUNTER — Other Ambulatory Visit (HOSPITAL_COMMUNITY): Payer: Self-pay

## 2023-08-05 ENCOUNTER — Other Ambulatory Visit: Payer: Self-pay

## 2023-08-05 ENCOUNTER — Other Ambulatory Visit (HOSPITAL_COMMUNITY): Payer: Self-pay

## 2023-08-05 MED ORDER — OXYCODONE HCL 20 MG PO TABS
20.0000 mg | ORAL_TABLET | Freq: Four times a day (QID) | ORAL | 0 refills | Status: AC | PRN
  Filled 2023-08-05 – 2023-08-08 (×3): qty 120, 30d supply, fill #0

## 2023-08-05 MED ORDER — DIAZEPAM 5 MG PO TABS
5.0000 mg | ORAL_TABLET | Freq: Two times a day (BID) | ORAL | 0 refills | Status: DC | PRN
  Filled 2023-08-06 – 2023-08-11 (×2): qty 60, 30d supply, fill #0

## 2023-08-05 MED ORDER — BUPRENORPHINE HCL 8 MG SL SUBL
8.0000 mg | SUBLINGUAL_TABLET | Freq: Two times a day (BID) | SUBLINGUAL | 0 refills | Status: DC
  Filled 2023-08-05: qty 60, 30d supply, fill #0

## 2023-08-05 MED ORDER — LOSARTAN POTASSIUM 50 MG PO TABS
50.0000 mg | ORAL_TABLET | Freq: Every day | ORAL | 1 refills | Status: AC
  Filled 2023-08-05: qty 90, 90d supply, fill #0

## 2023-08-06 ENCOUNTER — Other Ambulatory Visit (HOSPITAL_COMMUNITY): Payer: Self-pay

## 2023-08-08 ENCOUNTER — Other Ambulatory Visit (HOSPITAL_COMMUNITY): Payer: Self-pay

## 2023-08-11 ENCOUNTER — Other Ambulatory Visit (HOSPITAL_COMMUNITY): Payer: Self-pay

## 2023-08-19 ENCOUNTER — Other Ambulatory Visit (HOSPITAL_COMMUNITY): Payer: Self-pay

## 2023-08-19 MED ORDER — DOXYCYCLINE HYCLATE 100 MG PO TABS
100.0000 mg | ORAL_TABLET | Freq: Two times a day (BID) | ORAL | 0 refills | Status: AC
  Filled 2023-08-19 – 2023-08-25 (×2): qty 10, 5d supply, fill #0

## 2023-08-20 ENCOUNTER — Inpatient Hospital Stay: Attending: Genetic Counselor | Admitting: Genetic Counselor

## 2023-08-20 ENCOUNTER — Inpatient Hospital Stay: Attending: Genetic Counselor

## 2023-08-20 ENCOUNTER — Encounter: Payer: Self-pay | Admitting: Genetic Counselor

## 2023-08-20 ENCOUNTER — Other Ambulatory Visit: Payer: Self-pay | Admitting: Genetic Counselor

## 2023-08-20 DIAGNOSIS — Z85828 Personal history of other malignant neoplasm of skin: Secondary | ICD-10-CM | POA: Insufficient documentation

## 2023-08-20 DIAGNOSIS — Z860100 Personal history of colon polyps, unspecified: Secondary | ICD-10-CM | POA: Diagnosis present

## 2023-08-20 DIAGNOSIS — Z800 Family history of malignant neoplasm of digestive organs: Secondary | ICD-10-CM | POA: Diagnosis not present

## 2023-08-20 DIAGNOSIS — Z1379 Encounter for other screening for genetic and chromosomal anomalies: Secondary | ICD-10-CM

## 2023-08-20 LAB — GENETIC SCREENING ORDER

## 2023-08-20 NOTE — Progress Notes (Signed)
 REFERRING PROVIDER: Ace Holder, MD [address] [place],  [place] [address]  PRIMARY PROVIDER:  Patient, No Pcp Per  PRIMARY REASON FOR VISIT:  1. Personal history of colon polyps, unspecified   2. Personal history of skin cancer   3. Family history of colon cancer   4. Family history of pancreatic cancer    HISTORY OF PRESENT ILLNESS:   Mr. Macaraeg, a 58 y.o. male, was seen for a  cancer genetics consultation at the request of Dr. General Kenner due to a personal history of colon polyps and family history of colon cancer.  Mr. Flury presents to clinic today to discuss the possibility of a hereditary predisposition to cancer, to discuss genetic testing, and to further clarify his future cancer risks, as well as potential cancer risks for family members.    CANCER HISTORY:  Mr. Kuhner has a personal history of skin cancer (basal and squamous cell carcinoma). Mr. Tollison has had >10 adenomatous polyps over the course of 3 colonoscopies. His colonoscopy in 2018 identified 1 serrated adenoma and several hyperplastic polyps. His colonoscopy in 2022 identified 9 tubular adenomas and several hyperplastic polyps. His most recent colonoscopy in 2025 identified 4 polyps which were a mixture of sessile serrated polyps and tubular adenomas.  Past Medical History:  Diagnosis Date   Allergy    Anxiety    Arthritis    Asthma    Back pain    Cancer (HCC)    skin cancer    Depression    Diabetes mellitus without complication (HCC)    GERD (gastroesophageal reflux disease)    Gout    Hyperlipidemia    Hypertension    Mitral valve prolapse    Seizures (HCC)     Past Surgical History:  Procedure Laterality Date   BACK SURGERY     x2   COLONOSCOPY     2018   KNEE SURGERY     POLYPECTOMY     SHOULDER SURGERY Right    x2    Social History   Socioeconomic History   Marital status: Married    Spouse name: Not on file   Number of children: Not on file   Years of education:  Not on file   Highest education level: Not on file  Occupational History   Not on file  Tobacco Use   Smoking status: Never   Smokeless tobacco: Current    Types: Snuff   Tobacco comments:    Instructed not to chew on the day of the procedure.  Vaping Use   Vaping status: Never Used  Substance and Sexual Activity   Alcohol  use: Not Currently   Drug use: No   Sexual activity: Not on file  Other Topics Concern   Not on file  Social History Narrative   Not on file   Social Drivers of Health   Financial Resource Strain: Not on file  Food Insecurity: Not on file  Transportation Needs: Not on file  Physical Activity: Not on file  Stress: Not on file  Social Connections: Not on file     FAMILY HISTORY:  We obtained a detailed, 4-generation family history.  Significant diagnoses are listed below: Family History  Problem Relation Age of Onset   Colon cancer Mother 50 - 13   Skin cancer Father        squamous cell   Skin cancer Sister        basal/squamous cell   Leukemia Sister 86  Colon cancer Brother 1       he was a 9/11 [employer]- 9/11 relayed colon cancer   Colon cancer Maternal Grandfather    Pancreatic cancer Maternal Grandfather    Skin cancer Paternal Grandfather        squamous cell   Colon polyps Neg Hx    Esophageal cancer Neg Hx    Rectal cancer Neg Hx    Stomach cancer Neg Hx      Mr. Mayberry is unaware of previous family history of genetic testing for hereditary cancer risks. There is no reported Ashkenazi Jewish ancestry.   GENETIC COUNSELING ASSESSMENT: Mr. Borthwick is a 58 y.o. male with a personal history of polyposis and family history of cancer which is somewhat suggestive of a hereditary predisposition to cancer. We, therefore, discussed and recommended the following at today's visit.   DISCUSSION: We discussed that 5 - 10% of cancer is hereditary, with most cases of polyposis associated with APC and MUTYH.  There are other genes that can be associated  with hereditary polyposis/cancer syndromes.  We discussed that testing is beneficial for several reasons including knowing how to follow individuals and understanding if other family members could be at risk for cancer and allowing them to undergo genetic testing.   We reviewed the characteristics, features and inheritance patterns of hereditary cancer syndromes. We also discussed genetic testing, including the appropriate family members to test, the process of testing, insurance coverage and turn-around-time for results. We discussed the implications of a negative, positive, carrier and/or variant of uncertain significant result. We recommended Mr. Novelo pursue genetic testing for a panel that includes genes associated with polyposis and cancer.   Mr. Mahoney was offered a common hereditary cancer panel (40 genes) and an expanded pan-cancer panel (77 genes). Mr. Mcloud was informed of the benefits and limitations of each panel, including that expanded pan-cancer panels contain genes that do not have clear management guidelines at this point in time.  We also discussed that as the number of genes included on a panel increases, the chances of variants of uncertain significance increases. After considering the benefits and limitations of each gene panel, Mr. Missey elected to have [facility] CancerNext-Expanded Panel.  The CancerNext-Expanded gene panel offered by [facility] and includes sequencing, rearrangement, and RNA analysis for the following 77 genes: AIP, ALK, APC, ATM, AXIN2, BAP1, BARD1, BMPR1A, BRCA1, BRCA2, BRIP1, CDC73, CDH1, CDK4, CDKN1B, CDKN2A, CEBPA, CHEK2, CTNNA1, DDX41, DICER1, ETV6, FH, FLCN, GATA2, LZTR1, MAX, MBD4, MEN1, MET, MLH1, MSH2, MSH3, MSH6, MUTYH, NF1, NF2, NTHL1, PALB2, PHOX2B, PMS2, POT1, PRKAR1A, PTCH1, PTEN, RAD51C, RAD51D, RB1, RET, RPS20, RUNX1, SDHA, SDHAF2, SDHB, SDHC, SDHD, SMAD4, SMARCA4, SMARCB1, SMARCE1, STK11, SUFU, TMEM127, TP53, TSC1, TSC2, VHL, and WT1 (sequencing and  deletion/duplication); EGFR, HOXB13, KIT, MITF, PDGFRA, POLD1, and POLE (sequencing only); EPCAM and GREM1 (deletion/duplication only).   Based on Mr. Gundrum personal history of >10 adenomatous polyps and family history of cancer, he meets medical criteria for genetic testing. Despite that he meets criteria, he may still have an out of pocket cost. We discussed that if his out of pocket cost for testing is over $100, the laboratory should contact them to discuss self-pay prices, patient pay assistance programs, if applicable, and other billing options.  PLAN: After considering the risks, benefits, and limitations, Mr. Lindroth provided informed consent to pursue genetic testing and the blood sample was sent to [facility] for analysis of the CancerNext-Expanded Panel. Results should be available within approximately 2-3 weeks' time,  at which point they will be disclosed by telephone to Mr. Kovalenko, as will any additional recommendations warranted by these results. Mr. Tooker will receive a summary of his genetic counseling visit and a copy of his results once available. This information will also be available in Epic.   Mr. Anspach questions were answered to his satisfaction today. Our contact information was provided should additional questions or concerns arise. Thank you for the referral and allowing us  to share in the care of your patient.   Majesta Leichter, MS, [facility] Genetic Counselor [email or web address] (P) [phone]  60 minutes were spent on the date of the encounter in service to the patient including preparation, face-to-face consultation, documentation and care coordination. The patient was seen alone.  Drs. Gudena and/or Maryalice Smaller were available to discuss this case as needed.   _______________________________________________________________________ For Office Staff:  Number of people involved in session: 1 Was an Intern/ student involved with case: no

## 2023-08-22 ENCOUNTER — Other Ambulatory Visit (HOSPITAL_COMMUNITY): Payer: Self-pay

## 2023-08-22 ENCOUNTER — Other Ambulatory Visit: Payer: Self-pay

## 2023-08-22 MED ORDER — PRAVASTATIN SODIUM 10 MG PO TABS
10.0000 mg | ORAL_TABLET | Freq: Every day | ORAL | 0 refills | Status: DC
  Filled 2023-08-22: qty 90, 90d supply, fill #0

## 2023-08-25 ENCOUNTER — Other Ambulatory Visit (HOSPITAL_COMMUNITY): Payer: Self-pay

## 2023-08-29 ENCOUNTER — Other Ambulatory Visit (HOSPITAL_COMMUNITY): Payer: Self-pay

## 2023-08-29 MED ORDER — LOSARTAN POTASSIUM-HCTZ 50-12.5 MG PO TABS
1.0000 | ORAL_TABLET | Freq: Every day | ORAL | 0 refills | Status: AC
  Filled 2023-08-29: qty 90, 90d supply, fill #0

## 2023-09-02 ENCOUNTER — Other Ambulatory Visit (HOSPITAL_COMMUNITY): Payer: Self-pay

## 2023-09-02 ENCOUNTER — Telehealth: Payer: Self-pay | Admitting: Genetic Counselor

## 2023-09-02 ENCOUNTER — Encounter: Payer: Self-pay | Admitting: Genetic Counselor

## 2023-09-02 DIAGNOSIS — Z1379 Encounter for other screening for genetic and chromosomal anomalies: Secondary | ICD-10-CM | POA: Insufficient documentation

## 2023-09-02 MED ORDER — BUPRENORPHINE HCL 8 MG SL SUBL
8.0000 mg | SUBLINGUAL_TABLET | Freq: Two times a day (BID) | SUBLINGUAL | 0 refills | Status: DC
  Filled 2023-09-02: qty 60, 30d supply, fill #0

## 2023-09-02 MED ORDER — DIAZEPAM 5 MG PO TABS
5.0000 mg | ORAL_TABLET | Freq: Two times a day (BID) | ORAL | 0 refills | Status: DC
  Filled 2023-09-02 – 2023-09-10 (×4): qty 60, 30d supply, fill #0

## 2023-09-02 MED ORDER — OXYCODONE HCL 20 MG PO TABS
20.0000 mg | ORAL_TABLET | Freq: Four times a day (QID) | ORAL | 0 refills | Status: AC
  Filled 2023-09-03 – 2023-09-08 (×3): qty 120, 30d supply, fill #0

## 2023-09-02 NOTE — Telephone Encounter (Signed)
 I contacted Mr. Bott to discuss his genetic testing results. No pathogenic variants were identified in the 77 genes analyzed. Detailed clinic note to follow.  The test report has been scanned into EPIC and is located under the Molecular Pathology section of the Results Review tab.  A portion of the result report is included below for reference.   Sarina Robleto, MS, [facility] Genetic Counselor [email or web address] (P) [phone]

## 2023-09-03 ENCOUNTER — Other Ambulatory Visit (HOSPITAL_COMMUNITY): Payer: Self-pay

## 2023-09-08 ENCOUNTER — Other Ambulatory Visit (HOSPITAL_COMMUNITY): Payer: Self-pay

## 2023-09-10 ENCOUNTER — Other Ambulatory Visit (HOSPITAL_COMMUNITY): Payer: Self-pay

## 2023-09-12 ENCOUNTER — Ambulatory Visit: Payer: Self-pay | Admitting: Genetic Counselor

## 2023-09-12 NOTE — Progress Notes (Signed)
 HPI:   [name] was previously seen in the [facility] due to a personal history of colon polyps/family history of cancer and concerns regarding a hereditary predisposition to cancer. Please refer to our prior cancer genetics clinic note for more information regarding our discussion, assessment and recommendations, at the time. [name] recent genetic test results were disclosed to him, as were recommendations warranted by these results. These results and recommendations are discussed in more detail below.  CANCER HISTORY:  Oncology History   No history exists.    FAMILY HISTORY:  We obtained a detailed, 4-generation family history.  Significant diagnoses are listed below:      Family History  Problem Relation Age of Onset   Colon cancer Mother 43 - 36   Skin cancer Father          squamous cell   Skin cancer Sister          basal/squamous cell   Leukemia Sister 11   Colon cancer Brother 23        he was a 9/11 [employer]- 9/11 relayed colon cancer   Colon cancer Maternal Grandfather     Pancreatic cancer Maternal Grandfather     Skin cancer Paternal Grandfather          squamous cell   Colon polyps Neg Hx     Esophageal cancer Neg Hx     Rectal cancer Neg Hx     Stomach cancer Neg Hx             [name] is unaware of previous family history of genetic testing for hereditary cancer risks. There is no reported Ashkenazi Jewish ancestry.   GENETIC TEST RESULTS:  The [facility] CancerNext-Expanded Panel found no pathogenic mutations.  The CancerNext-Expanded gene panel offered by [facility] and includes sequencing, rearrangement, and RNA analysis for the following 77 genes: AIP, ALK, APC, ATM, AXIN2, BAP1, BARD1, BMPR1A, BRCA1, BRCA2, BRIP1, CDC73, CDH1, CDK4, CDKN1B, CDKN2A, CEBPA, CHEK2, CTNNA1, DDX41, DICER1, ETV6, FH, FLCN, GATA2, LZTR1, MAX, MBD4, MEN1, MET, MLH1, MSH2, MSH3, MSH6, MUTYH, NF1, NF2, NTHL1, PALB2, PHOX2B, PMS2, POT1, PRKAR1A, PTCH1, PTEN,  RAD51C, RAD51D, RB1, RET, RPS20, RUNX1, SDHA, SDHAF2, SDHB, SDHC, SDHD, SMAD4, SMARCA4, SMARCB1, SMARCE1, STK11, SUFU, TMEM127, TP53, TSC1, TSC2, VHL, and WT1 (sequencing and deletion/duplication); EGFR, HOXB13, KIT, MITF, PDGFRA, POLD1, and POLE (sequencing only); EPCAM and GREM1 (deletion/duplication only).   The test report has been scanned into EPIC and is located under the Molecular Pathology section of the Results Review tab.  A portion of the result report is included below for reference. Genetic testing reported out on 09/02/2023.       Even though a pathogenic variant was not identified, possible explanations for the polyposis/cancer in the family may include: There may be no hereditary risk for colon polyps/cancer in the family. The polyposis/cancers in [name] and/or his family may be due to other genetic or environmental factors. There may be a gene mutation in one of these genes that current testing methods cannot detect, but that chance is small. There could be another gene that has not yet been discovered, or that we have not yet tested, that is responsible for the polyposis/cancer diagnoses in the family.  It is also possible there is a hereditary cause for the cancer in the family that [name] did not inherit.  Therefore, it is important to remain in touch with cancer genetics in the future so that we can continue to offer  [name] the most up to date genetic testing.   ADDITIONAL GENETIC TESTING:  We discussed with [name] that his genetic testing was fairly extensive.  If there are genes identified to increase polyposis/cancer risk that can be analyzed in the future, we would be happy to discuss and coordinate this testing at that time.    CANCER SCREENING RECOMMENDATIONS:  [name] test result is considered negative (normal). This means that we have not identified a hereditary cause for his personal history of colon polyps or family history of cancer at this time. An  individual's cancer risk and medical management are not determined by genetic test results alone. Overall cancer risk assessment incorporates additional factors, including personal medical history, family history, and any available genetic information that may result in a personalized plan for cancer prevention and surveillance. Therefore, it is recommended he continue to follow the cancer management and screening guidelines provided by his healthcare providers.  Based on the reported personal and family history, specific cancer screenings for [name] and his family include:  Colon Cancer Screening: Due to [name] family history of colon cancer, he is recommended to repeat colonoscopies at least every 5 years. More frequent colonoscopies may be recommended if polyps are identified.  RECOMMENDATIONS FOR FAMILY MEMBERS:   Since he did not inherit a mutation in a cancer predisposition gene included on this panel, his children could not have inherited a mutation from him in one of these genes. Other members of the family may still carry a pathogenic variant in one of these genes that [facility] did not inherit. Based on the family history, we recommend his mother have genetic counseling and testing.   FOLLOW-UP:  Cancer genetics is a rapidly advancing field and it is possible that new genetic tests will be appropriate for him and/or his family members in the future. We encouraged him to remain in contact with cancer genetics on an annual basis so we can update his personal and family histories and let him know of advances in cancer genetics that may benefit this family.   Our contact number was provided. [name] questions were answered to his satisfaction, and he knows he is welcome to call us  at anytime with additional questions or concerns.   Schwanda Zima, MS, [facility] Genetic Counselor [email or web address] (P) [phone]

## 2023-09-24 ENCOUNTER — Other Ambulatory Visit (HOSPITAL_COMMUNITY): Payer: Self-pay

## 2023-09-24 MED ORDER — FUROSEMIDE 20 MG PO TABS
20.0000 mg | ORAL_TABLET | Freq: Every day | ORAL | 0 refills | Status: DC
  Filled 2023-09-24: qty 30, 30d supply, fill #0

## 2023-09-30 ENCOUNTER — Other Ambulatory Visit (HOSPITAL_COMMUNITY): Payer: Self-pay

## 2023-09-30 MED ORDER — DIAZEPAM 5 MG PO TABS
5.0000 mg | ORAL_TABLET | Freq: Two times a day (BID) | ORAL | 0 refills | Status: DC | PRN
  Filled 2023-09-30 – 2023-10-10 (×2): qty 60, 30d supply, fill #0

## 2023-09-30 MED ORDER — BUPRENORPHINE HCL 8 MG SL SUBL
8.0000 mg | SUBLINGUAL_TABLET | Freq: Two times a day (BID) | SUBLINGUAL | 0 refills | Status: DC
  Filled 2023-09-30: qty 60, 30d supply, fill #0

## 2023-09-30 MED ORDER — OXYCODONE HCL 20 MG PO TABS
20.0000 mg | ORAL_TABLET | Freq: Four times a day (QID) | ORAL | 0 refills | Status: AC | PRN
  Filled 2023-10-07 – 2023-10-08 (×3): qty 120, 30d supply, fill #0

## 2023-10-07 ENCOUNTER — Other Ambulatory Visit (HOSPITAL_COMMUNITY): Payer: Self-pay

## 2023-10-08 ENCOUNTER — Other Ambulatory Visit (HOSPITAL_COMMUNITY): Payer: Self-pay

## 2023-10-08 ENCOUNTER — Other Ambulatory Visit: Payer: Self-pay

## 2023-10-10 ENCOUNTER — Other Ambulatory Visit (HOSPITAL_COMMUNITY): Payer: Self-pay

## 2023-10-23 ENCOUNTER — Other Ambulatory Visit (HOSPITAL_COMMUNITY): Payer: Self-pay

## 2023-10-23 ENCOUNTER — Other Ambulatory Visit: Payer: Self-pay

## 2023-10-23 MED ORDER — LOSARTAN POTASSIUM 25 MG PO TABS
25.0000 mg | ORAL_TABLET | Freq: Every day | ORAL | 0 refills | Status: DC
  Filled 2023-10-23: qty 90, 90d supply, fill #0

## 2023-10-23 MED ORDER — FUROSEMIDE 20 MG PO TABS
20.0000 mg | ORAL_TABLET | Freq: Every day | ORAL | 0 refills | Status: DC
  Filled 2023-10-23: qty 30, 30d supply, fill #0

## 2023-10-23 MED ORDER — SERTRALINE HCL 100 MG PO TABS
100.0000 mg | ORAL_TABLET | Freq: Every day | ORAL | 1 refills | Status: AC
  Filled 2023-10-23: qty 90, 90d supply, fill #0
  Filled 2024-01-22: qty 90, 90d supply, fill #1

## 2023-10-28 ENCOUNTER — Other Ambulatory Visit: Payer: Self-pay

## 2023-10-28 ENCOUNTER — Other Ambulatory Visit (HOSPITAL_COMMUNITY): Payer: Self-pay

## 2023-10-28 MED ORDER — DIAZEPAM 5 MG PO TABS
5.0000 mg | ORAL_TABLET | Freq: Two times a day (BID) | ORAL | 0 refills | Status: DC | PRN
  Filled 2023-10-28 – 2023-11-10 (×2): qty 60, 30d supply, fill #0

## 2023-10-28 MED ORDER — BUPRENORPHINE HCL 8 MG SL SUBL
8.0000 mg | SUBLINGUAL_TABLET | Freq: Two times a day (BID) | SUBLINGUAL | 0 refills | Status: DC
  Filled 2023-10-28: qty 60, 30d supply, fill #0

## 2023-10-28 MED ORDER — OXYCODONE HCL 20 MG PO TABS
20.0000 mg | ORAL_TABLET | Freq: Four times a day (QID) | ORAL | 0 refills | Status: AC
  Filled 2023-10-28 – 2023-11-07 (×3): qty 120, 30d supply, fill #0

## 2023-10-29 ENCOUNTER — Other Ambulatory Visit (HOSPITAL_COMMUNITY): Payer: Self-pay

## 2023-10-29 ENCOUNTER — Other Ambulatory Visit: Payer: Self-pay

## 2023-10-29 MED ORDER — LOSARTAN POTASSIUM 25 MG PO TABS
25.0000 mg | ORAL_TABLET | Freq: Every day | ORAL | 1 refills | Status: AC
  Filled 2023-10-29 – 2024-01-31 (×2): qty 90, 90d supply, fill #0

## 2023-10-29 MED ORDER — ARIPIPRAZOLE 5 MG PO TABS
5.0000 mg | ORAL_TABLET | Freq: Every day | ORAL | 1 refills | Status: AC
  Filled 2023-10-29: qty 90, 90d supply, fill #0
  Filled 2023-11-24 – 2024-01-31 (×2): qty 90, 90d supply, fill #1

## 2023-10-30 ENCOUNTER — Other Ambulatory Visit (HOSPITAL_COMMUNITY): Payer: Self-pay

## 2023-11-07 ENCOUNTER — Other Ambulatory Visit (HOSPITAL_COMMUNITY): Payer: Self-pay

## 2023-11-07 ENCOUNTER — Other Ambulatory Visit: Payer: Self-pay

## 2023-11-07 MED ORDER — ALBUTEROL SULFATE HFA 108 (90 BASE) MCG/ACT IN AERS
1.0000 | INHALATION_SPRAY | RESPIRATORY_TRACT | 1 refills | Status: AC
  Filled 2023-11-07: qty 6.7, 20d supply, fill #0

## 2023-11-10 ENCOUNTER — Other Ambulatory Visit (HOSPITAL_COMMUNITY): Payer: Self-pay

## 2023-11-10 MED ORDER — FLUOROURACIL 5 % EX CREA
1.0000 | TOPICAL_CREAM | Freq: Two times a day (BID) | CUTANEOUS | 3 refills | Status: AC
  Filled 2023-11-10: qty 40, 14d supply, fill #0

## 2023-11-11 ENCOUNTER — Other Ambulatory Visit (HOSPITAL_COMMUNITY): Payer: Self-pay

## 2023-11-11 MED ORDER — ERGOCALCIFEROL 1.25 MG (50000 UT) PO CAPS
50000.0000 [IU] | ORAL_CAPSULE | ORAL | 2 refills | Status: AC
  Filled 2023-11-11: qty 12, 84d supply, fill #0
  Filled 2024-01-31: qty 12, 84d supply, fill #1

## 2023-11-13 ENCOUNTER — Other Ambulatory Visit (HOSPITAL_COMMUNITY): Payer: Self-pay

## 2023-11-13 MED ORDER — PRAVASTATIN SODIUM 10 MG PO TABS
10.0000 mg | ORAL_TABLET | Freq: Every day | ORAL | 1 refills | Status: AC
  Filled 2023-11-13: qty 90, 90d supply, fill #0
  Filled 2024-02-13: qty 90, 90d supply, fill #1

## 2023-11-20 ENCOUNTER — Other Ambulatory Visit (HOSPITAL_COMMUNITY): Payer: Self-pay

## 2023-11-20 MED ORDER — FUROSEMIDE 20 MG PO TABS
20.0000 mg | ORAL_TABLET | Freq: Every day | ORAL | 1 refills | Status: DC
  Filled 2023-11-20: qty 30, 30d supply, fill #0
  Filled 2023-12-25: qty 30, 30d supply, fill #1

## 2023-11-22 ENCOUNTER — Other Ambulatory Visit (HOSPITAL_COMMUNITY): Payer: Self-pay

## 2023-11-25 ENCOUNTER — Other Ambulatory Visit (HOSPITAL_COMMUNITY): Payer: Self-pay

## 2023-11-26 ENCOUNTER — Other Ambulatory Visit (HOSPITAL_COMMUNITY): Payer: Self-pay

## 2023-11-26 MED ORDER — DIAZEPAM 5 MG PO TABS
5.0000 mg | ORAL_TABLET | Freq: Two times a day (BID) | ORAL | 0 refills | Status: DC | PRN
  Filled 2023-12-10: qty 60, 30d supply, fill #0

## 2023-11-26 MED ORDER — OXYCODONE HCL 20 MG PO TABS
20.0000 mg | ORAL_TABLET | Freq: Four times a day (QID) | ORAL | 0 refills | Status: AC | PRN
  Filled 2023-12-08: qty 120, 30d supply, fill #0

## 2023-11-26 MED ORDER — BUPRENORPHINE HCL 8 MG SL SUBL
8.0000 mg | SUBLINGUAL_TABLET | Freq: Two times a day (BID) | SUBLINGUAL | 0 refills | Status: DC
  Filled 2023-11-26: qty 60, 30d supply, fill #0

## 2023-11-28 ENCOUNTER — Encounter: Payer: Self-pay | Admitting: Gastroenterology

## 2023-12-08 ENCOUNTER — Other Ambulatory Visit: Payer: Self-pay

## 2023-12-08 ENCOUNTER — Other Ambulatory Visit (HOSPITAL_COMMUNITY): Payer: Self-pay

## 2023-12-10 ENCOUNTER — Other Ambulatory Visit (HOSPITAL_COMMUNITY): Payer: Self-pay

## 2023-12-10 ENCOUNTER — Other Ambulatory Visit: Payer: Self-pay

## 2023-12-25 ENCOUNTER — Other Ambulatory Visit (HOSPITAL_COMMUNITY): Payer: Self-pay

## 2023-12-26 ENCOUNTER — Other Ambulatory Visit (HOSPITAL_BASED_OUTPATIENT_CLINIC_OR_DEPARTMENT_OTHER): Payer: Self-pay

## 2023-12-26 ENCOUNTER — Other Ambulatory Visit (HOSPITAL_COMMUNITY): Payer: Self-pay

## 2023-12-26 MED ORDER — BUPRENORPHINE HCL 8 MG SL SUBL
8.0000 mg | SUBLINGUAL_TABLET | Freq: Two times a day (BID) | SUBLINGUAL | 0 refills | Status: DC
  Filled 2023-12-26: qty 60, 30d supply, fill #0

## 2024-01-01 ENCOUNTER — Other Ambulatory Visit (HOSPITAL_COMMUNITY): Payer: Self-pay

## 2024-01-02 ENCOUNTER — Other Ambulatory Visit (HOSPITAL_COMMUNITY): Payer: Self-pay

## 2024-01-07 ENCOUNTER — Other Ambulatory Visit (HOSPITAL_COMMUNITY): Payer: Self-pay

## 2024-01-07 MED ORDER — DIAZEPAM 5 MG PO TABS
5.0000 mg | ORAL_TABLET | Freq: Two times a day (BID) | ORAL | 0 refills | Status: DC | PRN
  Filled 2024-01-07: qty 60, 30d supply, fill #0

## 2024-01-07 MED ORDER — OXYCODONE HCL 20 MG PO TABS
ORAL_TABLET | ORAL | 0 refills | Status: AC
  Filled 2024-01-07: qty 120, 30d supply, fill #0

## 2024-01-22 ENCOUNTER — Other Ambulatory Visit (HOSPITAL_COMMUNITY): Payer: Self-pay

## 2024-01-22 MED ORDER — METFORMIN HCL 1000 MG PO TABS
1000.0000 mg | ORAL_TABLET | Freq: Two times a day (BID) | ORAL | 1 refills | Status: AC
  Filled 2024-01-22: qty 180, 90d supply, fill #0

## 2024-01-22 MED ORDER — FUROSEMIDE 20 MG PO TABS
20.0000 mg | ORAL_TABLET | Freq: Every day | ORAL | 1 refills | Status: DC | PRN
  Filled 2024-01-22: qty 30, 30d supply, fill #0
  Filled 2024-02-27: qty 30, 30d supply, fill #1

## 2024-01-23 ENCOUNTER — Other Ambulatory Visit (HOSPITAL_COMMUNITY): Payer: Self-pay

## 2024-01-23 MED ORDER — DIAZEPAM 5 MG PO TABS
5.0000 mg | ORAL_TABLET | Freq: Two times a day (BID) | ORAL | 0 refills | Status: DC | PRN
  Filled 2024-01-23 – 2024-02-06 (×5): qty 60, 30d supply, fill #0

## 2024-01-23 MED ORDER — BUPRENORPHINE HCL 8 MG SL SUBL
8.0000 mg | SUBLINGUAL_TABLET | Freq: Two times a day (BID) | SUBLINGUAL | 0 refills | Status: DC
  Filled 2024-01-23: qty 60, 30d supply, fill #0

## 2024-01-23 MED ORDER — OXYCODONE HCL 20 MG PO TABS
20.0000 mg | ORAL_TABLET | Freq: Four times a day (QID) | ORAL | 0 refills | Status: DC | PRN
  Filled 2024-01-23 – 2024-02-06 (×5): qty 120, 30d supply, fill #0

## 2024-01-26 ENCOUNTER — Other Ambulatory Visit (HOSPITAL_COMMUNITY): Payer: Self-pay | Admitting: Physician Assistant

## 2024-01-26 ENCOUNTER — Other Ambulatory Visit (HOSPITAL_COMMUNITY): Payer: Self-pay

## 2024-01-26 ENCOUNTER — Encounter (HOSPITAL_COMMUNITY): Payer: Self-pay

## 2024-01-26 ENCOUNTER — Ambulatory Visit (HOSPITAL_COMMUNITY): Admission: RE | Admit: 2024-01-26 | Source: Ambulatory Visit

## 2024-01-26 DIAGNOSIS — R2243 Localized swelling, mass and lump, lower limb, bilateral: Secondary | ICD-10-CM

## 2024-01-30 ENCOUNTER — Other Ambulatory Visit (HOSPITAL_COMMUNITY): Payer: Self-pay

## 2024-01-31 ENCOUNTER — Other Ambulatory Visit (HOSPITAL_COMMUNITY): Payer: Self-pay

## 2024-02-02 ENCOUNTER — Ambulatory Visit (HOSPITAL_COMMUNITY)
Admission: RE | Admit: 2024-02-02 | Discharge: 2024-02-02 | Disposition: A | Source: Ambulatory Visit | Attending: Physician Assistant | Admitting: Physician Assistant

## 2024-02-02 DIAGNOSIS — R2243 Localized swelling, mass and lump, lower limb, bilateral: Secondary | ICD-10-CM | POA: Insufficient documentation

## 2024-02-04 ENCOUNTER — Other Ambulatory Visit (HOSPITAL_COMMUNITY): Payer: Self-pay

## 2024-02-06 ENCOUNTER — Other Ambulatory Visit: Payer: Self-pay

## 2024-02-06 ENCOUNTER — Other Ambulatory Visit (HOSPITAL_COMMUNITY): Payer: Self-pay

## 2024-02-13 ENCOUNTER — Other Ambulatory Visit (HOSPITAL_COMMUNITY): Payer: Self-pay

## 2024-02-16 NOTE — Progress Notes (Unsigned)
 [facility]           OF [place]  History and Physical   Robert Wilkerson is a 58 y.o. male who presents with bilateral leg swelling and hx of right leg vein stripping/ablation around 30 years ago.   Pt states that he has bilateral leg swelling.  He states he thinks the swelling is better in the mornings after he has been in bed.  He has achiness and tiredness in his legs as the day goes on.  He does wear knee high compression socks when he is working.  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  He does a lot of sitting and standing.  He has never had a DVT.  He does have family hx with his grandfather.  He does have some superficial veins and he states these have never bled.   He states the scale at home this morning was 240 lbs and today in our office is 273 lbs.  He states he does not really elevate his legs.  He has been working out at [employer]    He states that he does get numbness in his feet.   He has hx of back pain and hx of back surgeries in the past.    The pt is on a statin for cholesterol management.  The pt is not on a daily aspirin.   Other AC:  none The pt is on ARB for hypertension.   The pt is  on medication for diabetes.   Tobacco hx:  current  Pt does have family hx of AAA with grandparent.  Past Medical History:  Diagnosis Date   Allergy    Anxiety    Arthritis    Asthma    Back pain    Cancer (HCC)    skin cancer    Depression    Diabetes mellitus without complication (HCC)    GERD (gastroesophageal reflux disease)    Gout    Hyperlipidemia    Hypertension    Mitral valve prolapse    Seizures (HCC)     Past Surgical History:  Procedure Laterality Date   BACK SURGERY     x2   COLONOSCOPY     2018   KNEE SURGERY     POLYPECTOMY     SHOULDER SURGERY Right    x2    Social History   Socioeconomic History   Marital status: Married    Spouse name: Not on file   Number of children: Not on file   Years of  education: Not on file   Highest education level: Not on file  Occupational History   Not on file  Tobacco Use   Smoking status: Never   Smokeless tobacco: Current    Types: Snuff   Tobacco comments:    Instructed not to chew on the day of the procedure.  Vaping Use   Vaping status: Never Used  Substance and Sexual Activity   Alcohol  use: Not Currently   Drug use: No   Sexual activity: Not on file  Other Topics Concern   Not on file  Social History Narrative   Not on file   Social Drivers of Health   Tobacco Use: High Risk (05/15/2023)   Patient History    Smoking Tobacco Use: Never    Smokeless Tobacco Use: Current    Passive Exposure: Not on file  Financial Resource Strain: Not on file  Food Insecurity: Not on file  Transportation Needs: Not on file  Physical Activity: Not on file  Stress: Not on file  Social Connections: Not on file  Intimate Partner Violence: Not on file  Depression (PHQ2-9): Not on file  Alcohol  Screen: Not on file  Housing: Not on file  Utilities: Not on file  Health Literacy: Not on file     Family History  Problem Relation Age of Onset   Colon cancer Mother 71 - 63   Skin cancer Father        squamous cell   Skin cancer Sister        basal/squamous cell   Leukemia Sister 60   Colon cancer Brother 66       he was a 9/11 [employer]- 9/11 relayed colon cancer   Colon cancer Maternal Grandfather    Pancreatic cancer Maternal Grandfather    Skin cancer Paternal Grandfather        squamous cell   Colon polyps Neg Hx    Esophageal cancer Neg Hx    Rectal cancer Neg Hx    Stomach cancer Neg Hx     Current Outpatient Medications  Medication Sig Dispense Refill   albuterol  (PROVENTIL  HFA) 108 (90 Base) MCG/ACT inhaler Inhale 1 puff into the lungs every 4 (four) hours. 6.7 g 1   albuterol  (VENTOLIN  HFA) 108 (90 Base) MCG/ACT inhaler Inhale 1-2 puffs into the lungs every 6 (six) hours as needed for wheezing or shortness of breath.      allopurinol  (ZYLOPRIM ) 100 MG tablet Take 100 mg by mouth daily.     allopurinol  (ZYLOPRIM ) 100 MG tablet TAKE 1 TABLET BY MOUTH DAILY 30 tablet 1   allopurinol  (ZYLOPRIM ) 100 MG tablet TAKE 1 TABLET BY MOUTH ONCE DAILY 30 tablet 2   allopurinol  (ZYLOPRIM ) 100 MG tablet Take 1 tablet (100 mg total) by mouth daily. 90 tablet 2   ARIPiprazole  (ABILIFY ) 5 MG tablet Take 5 mg by mouth daily. (Patient not taking: Reported on 05/15/2023)     ARIPiprazole  (ABILIFY ) 5 MG tablet TAKE 1 TABLET BY MOUTH ONCE DAILY (Patient taking differently: Take 5 mg by mouth daily.) 90 tablet 1   ARIPiprazole  (ABILIFY ) 5 MG tablet Take 1 tablet (5 mg total) by mouth daily. 90 tablet 1   buprenorphine  (SUBUTEX ) 8 MG SUBL SL tablet Place 1 tablet (8 mg total) under the tongue 2 (two) times daily. 60 tablet 0   cephALEXin  (KEFLEX ) 500 MG capsule Take 2 capsules by mouth 2 times a day (Patient not taking: Reported on 05/15/2023) 20 capsule 0   diazepam  (VALIUM ) 2 MG tablet Take 1 tablet by mouth two times daily as needed. (Patient not taking: Reported on 05/15/2023) 60 tablet 0   diazepam  (VALIUM ) 2 MG tablet Take 1 tablet (2 mg total) by mouth 2 (two) times daily as needed. (Patient not taking: Reported on 05/15/2023) 60 tablet 0   diazepam  (VALIUM ) 2 MG tablet Take 1 tablet by mouth two times daily as needed (Patient not taking: Reported on 05/15/2023) 60 tablet 0   diazepam  (VALIUM ) 2 MG tablet Take 1 tablet (2 mg total) by mouth 2 (two) times daily as needed. (Patient not taking: Reported on 05/15/2023) 60 tablet 0   diazepam  (VALIUM ) 5 MG tablet Take 1 (one) tablet by mouth two times daily as needed (Patient not taking: Reported on 05/15/2023) 60 tablet 0   diazepam  (VALIUM ) 5 MG tablet Take 1 tablet (5 mg total) by mouth two times daily as  needed. (Patient not taking: Reported on 05/15/2023) 60 tablet 0   diazepam  (VALIUM ) 5 MG tablet Take 1 tablet by mouth 2 times daily as needed. (Patient not taking: Reported on 05/15/2023) 60  tablet 0   diazepam  (VALIUM ) 5 MG tablet Take 1 tablet (5 mg total) by mouth 2 (two) times daily as needed. (Patient not taking: Reported on 05/15/2023) 60 tablet 0   diazepam  (VALIUM ) 5 MG tablet Take 1 (one) tablet by mouth two times daily, as needed (Patient not taking: Reported on 05/15/2023) 60 tablet 0   diazepam  (VALIUM ) 5 MG tablet Take 1 tablet (5 mg total) by mouth 2 (two) times daily as needed. (Patient not taking: Reported on 05/15/2023) 60 tablet 0   diazepam  (VALIUM ) 5 MG tablet Take 1 tablet (5 mg) by mouth 2 times daily as needed. (fill 09/11/22) (Patient not taking: Reported on 05/15/2023) 60 tablet 0   diazepam  (VALIUM ) 5 MG tablet Take 1 tablet (5 mg total) by mouth 2 (two) times daily as needed. 60 tablet 0   diazepam  (VALIUM ) 5 MG tablet Take 1 tablet (5 mg total) by mouth 2 (two) times daily as needed. (Patient not taking: Reported on 05/15/2023) 60 tablet 0   diazepam  (VALIUM ) 5 MG tablet Take 1 tablet (5 mg total) by mouth 2 (two) times daily as needed. (Patient not taking: Reported on 05/15/2023) 60 tablet 0   diazepam  (VALIUM ) 5 MG tablet Take 1 tablet (5 mg total) by mouth 2 (two) times daily as needed. (Patient not taking: Reported on 05/15/2023) 60 tablet 0   diazepam  (VALIUM ) 5 MG tablet Take 1 tablet (5 mg total) by mouth 2 (two) times daily as needed (fill 02/09/23) (Patient not taking: Reported on 05/15/2023) 60 tablet 0   diazepam  (VALIUM ) 5 MG tablet Take 1 tablet (5 mg total) by mouth 2 (two) times daily as needed. (Patient not taking: Reported on 05/15/2023) 60 tablet 0   diazepam  (VALIUM ) 5 MG tablet Take 1 (one) tablet by mouth two times daily, as needed 60 tablet 0   diazepam  (VALIUM ) 5 MG tablet Take 1 tablet (5 mg total) by mouth 2 (two) times daily as needed. 60 tablet 0   diazepam  (VALIUM ) 5 MG tablet Take 1 tablet (5 mg total) by mouth 2 (two) times daily. 60 tablet 0   diazepam  (VALIUM ) 5 MG tablet Take 1 tablet (5 mg total) by mouth 2 (two) times daily as needed. 60  tablet 0   diazepam  (VALIUM ) 5 MG tablet Take 1 tablet (5 mg total) by mouth 2 (two) times daily as needed. 60 tablet 0   doxycycline  (VIBRA -TABS) 100 MG tablet Take 1 tablet by mouth twice a day with meals. 10 tablet 0   ergocalciferol  (VITAMIN D2) 1.25 MG (50000 UT) capsule Take 1 capsule (50,000 Units total) by mouth once a week. 12 capsule 2   fluorouracil  (EFUDEX ) 5 % cream Apply thin layer to affected areas twice daily for 2 weeks, then stop. 40 g 0   fluorouracil  (EFUDEX ) 5 % cream Apply thin layer to affected areas twice a day x 2 weeks, then stop. 40 g 3   fluticasone  (FLONASE) 50 MCG/ACT nasal spray Place 2 sprays into both nostrils daily.     fluticasone -salmeterol (ADVAIR ) 100-50 MCG/ACT AEPB Inhale 1 puff by mouth into the lungs twice a day (Patient not taking: Reported on 05/15/2023) 180 each 3   fluticasone -salmeterol (ADVAIR ) 100-50 MCG/ACT AEPB Inhale 1 puff into the lungs 2 (two) times daily. (Patient not  taking: Reported on 05/15/2023) 180 each 3   fluticasone -salmeterol (ADVAIR ) 100-50 MCG/ACT AEPB Inhale 1 puff into the lungs 2 (two) times daily. (Patient not taking: Reported on 05/15/2023) 180 each 3   Fluticasone -Salmeterol (ADVAIR ) 100-50 MCG/DOSE AEPB 1 puff (Patient not taking: Reported on 05/15/2023)     furosemide  (LASIX ) 20 MG tablet Take 1 tablet (20 mg total) by mouth daily for edema 30 tablet 1   indomethacin  (INDOCIN ) 50 MG capsule Take 1 capsule (50 mg total) by mouth 3 (three) times daily with food as needed for gout (Patient not taking: Reported on 05/15/2023) 20 capsule 0   losartan  (COZAAR ) 25 MG tablet Take 25 mg by mouth daily.     losartan  (COZAAR ) 25 MG tablet Take 1 tablet (25 mg total) by mouth daily for kidney protection 90 tablet 1   losartan  (COZAAR ) 50 MG tablet Take 1 tablet (50 mg total) by mouth daily. 90 tablet 1   losartan -hydrochlorothiazide  (HYZAAR ) 50-12.5 MG tablet Take 1 tablet by mouth daily for blood pressure and swelling 90 tablet 0   metFORMIN   (GLUCOPHAGE ) 1000 MG tablet Take 1 tablet (1,000 mg total) by mouth 2 (two) times daily with a meal. 180 tablet 1   metFORMIN  (GLUCOPHAGE ) 500 MG tablet Take 500 mg by mouth 2 (two) times daily with a meal.     Multiple Vitamins-Minerals (MULTIVITAMINS THER. W/MINERALS) TABS Take 1 tablet by mouth daily.     naloxone  (NARCAN ) nasal spray 4 mg/0.1 mL Place 1 spray into the nostil  as needed, IF FOUND UNRESPONSIVE THEN SPRAY INTO NOSE AND CALL 911 IMMEDIATELY 2 each 1   naloxone  (NARCAN ) nasal spray 4 mg/0.1 mL 1 (one) spray, non-aerosol as needed, IF FOUND UNRESPONSIVE THEN SPRAY INTO NOSE AND CALL 911 IMMEDIATELY 1 each 1   naloxone  (NARCAN ) nasal spray 4 mg/0.1 mL Use 1 spray as needed, IF FOUND UNRESPONSIVE THEN SPRAY INTO NOSE AND CALL 911 IMMEDIATELY 2 each 1   naloxone  (NARCAN ) nasal spray 4 mg/0.1 mL Place 1 spray (4 mg total) into the nose as needed if found unresponsive then spray into nose and call 911 immediately 1 each 1   naproxen sodium (ANAPROX) 220 MG tablet Take 220 mg by mouth daily as needed (for pain).  (Patient not taking: Reported on 05/05/2020)     omeprazole (PRILOSEC OTC) 20 MG tablet Take 20 mg by mouth daily. (Patient not taking: Reported on 05/15/2023)     oxyCODONE  (OXYCONTIN ) 10 mg 12 hr tablet Take 1 tablet by mouth 2 times a day Fill on 09/13/20 (Patient not taking: Reported on 05/15/2023) 60 tablet 0   oxyCODONE  (ROXICODONE ) 15 MG immediate release tablet Take 15 mg by mouth 5 (five) times daily as needed. (Patient not taking: Reported on 05/15/2023)     Oxycodone  HCl 20 MG TABS Take 1 tablet by mouth four times daily as needed. 120 tablet 0   Oxycodone  HCl 20 MG TABS Take 1 tablet (20 mg total) by mouth 4 (four) times daily as needed. (Patient not taking: Reported on 05/15/2023) 120 tablet 0   Oxycodone  HCl 20 MG TABS Take 1 (one) Tablet by mouth four times daily, as needed (Patient not taking: Reported on 05/15/2023) 120 tablet 0   Oxycodone  HCl 20 MG TABS Take 1 tablet (20  mg total) by mouth 4 (four) times daily as needed. (Patient not taking: Reported on 05/15/2023) 112 tablet 0   Oxycodone  HCl 20 MG TABS Take 1 tablet (20 mg total) by mouth 4 (four)  times daily as needed. (Patient not taking: Reported on 05/15/2023) 112 tablet 0   Oxycodone  HCl 20 MG TABS Take 1 tablet (20 mg total) by mouth 4 (four) times daily as needed. (Patient not taking: Reported on 05/15/2023) 120 tablet 0   Oxycodone  HCl 20 MG TABS Take 1 tablet by mouth 4 times daily, as needed (Patient not taking: Reported on 05/15/2023) 120 tablet 0   Oxycodone  HCl 20 MG TABS Take 1 tablet (20 mg) by mouth 4 times daily as needed. (Patient not taking: Reported on 05/15/2023) 120 tablet 0   Oxycodone  HCl 20 MG TABS Take 1 tablet (20 mg total) by mouth 4 (four) times daily as needed. (Patient not taking: Reported on 05/15/2023) 120 tablet 0   Oxycodone  HCl 20 MG TABS Take 1 tablet (20 mg total) by mouth 4 (four) times daily as needed. (Patient not taking: Reported on 05/15/2023) 120 tablet 0   Oxycodone  HCl 20 MG TABS Take 1 tablet (20 mg total) by mouth 4 (four) times daily as needed. (Patient not taking: Reported on 05/15/2023) 120 tablet 0   Oxycodone  HCl 20 MG TABS Take 1 tablet (20 mg total) by mouth 4 (four) times daily as needed. 01/08/23 (Patient not taking: Reported on 05/15/2023) 120 tablet 0   Oxycodone  HCl 20 MG TABS Take 1 tablet (20 mg total) by mouth 4 (four) times daily as needed (Patient not taking: Reported on 05/15/2023) 120 tablet 0   Oxycodone  HCl 20 MG TABS Take 1 tablet (20 mg total) by mouth in the morning, at noon, in the evening, and at bedtime as needed. (Patient not taking: Reported on 05/15/2023) 120 tablet 0   Oxycodone  HCl 20 MG TABS Take 1 tablet (20 mg total) by mouth 4 (four) times daily as needed. (Patient not taking: Reported on 05/15/2023) 120 tablet 0   Oxycodone  HCl 20 MG TABS Take 1 tablet (20 mg total) by mouth 4 (four) times daily. 120 tablet 0   Oxycodone  HCl 20 MG TABS Take 1  tablet (20 mg total) by mouth 4 (four) times daily as needed. 120 tablet 0   Oxycodone  HCl 20 MG TABS Take 1 tablet (20 mg total) by mouth 4 (four) times daily as needed 120 tablet 0   Oxycodone  HCl 20 MG TABS Take 1 tablet (20 mg total) by mouth 4 (four) times daily as needed. 120 tablet 0   Oxycodone  HCl 20 MG TABS Take 1 tablet (20 mg total) by mouth 4 (four) times daily. 120 tablet 0   Oxycodone  HCl 20 MG TABS Take 1 tablet (20 mg total) by mouth 4 (four) times daily as needed. 120 tablet 0   Oxycodone  HCl 20 MG TABS Take 1 (one) Tablet by mouth four times daily, as needed 120 tablet 0   Oxycodone  HCl 20 MG TABS Take 1 tablet (20 mg total) by mouth 4 (four) times daily as needed. 120 tablet 0   pravastatin  (PRAVACHOL ) 10 MG tablet Take 10 mg by mouth at bedtime.     pravastatin  (PRAVACHOL ) 10 MG tablet TAKE 1 TABLET BY MOUTH ONCE DAILY 90 tablet 1   pravastatin  (PRAVACHOL ) 10 MG tablet TAKE 1 TABLET BY MOUTH ONCE A DAY (Patient not taking: Reported on 05/15/2023) 90 tablet 1   pravastatin  (PRAVACHOL ) 10 MG tablet Take 1 tablet (10 mg total) by mouth daily. 90 tablet 1   sertraline  (ZOLOFT ) 100 MG tablet Take 100 mg by mouth daily.     sertraline  (ZOLOFT ) 100 MG tablet TAKE  1 AND 1/2 TABLETS BY MOUTH ONCE DAILY 145 tablet 1   sertraline  (ZOLOFT ) 100 MG tablet Take 1 tablet (100 mg total) by mouth daily. 90 tablet 1   No current facility-administered medications for this visit.    Allergies[1]  REVIEW OF SYSTEMS:   [X]  denotes positive finding, [ ]  denotes negative finding Cardiac  Comments:  Chest pain or chest pressure:    Shortness of breath upon exertion:    Short of breath when lying flat:    Irregular heart rhythm:        Vascular    Pain in calf, thigh, or hip brought on by ambulation:    Pain in feet at night that wakes you up from your sleep:     Blood clot in your veins:    Leg swelling:  x       Pulmonary    Oxygen at home:    Productive cough:     Wheezing:          Neurologic    Sudden weakness in arms or legs:     Sudden numbness in arms or legs:     Sudden onset of difficulty speaking or slurred speech:    Temporary loss of vision in one eye:     Problems with dizziness:         Gastrointestinal    Blood in stool:     Vomited blood:         Genitourinary    Burning when urinating:     Blood in urine:        Psychiatric    Major depression:         Hematologic    Bleeding problems:    Problems with blood clotting too easily:        Skin    Rashes or ulcers:        Constitutional    Fever or chills:      PHYSICAL EXAMINATION:  Today's Vitals   02/17/24 0954  BP: 120/82  Pulse: 87  Temp: 97.9 F (36.6 C)  SpO2: 95%  Weight: 273 lb (123.8 kg)  Height: 5' 9 (1.753 m)  PainSc: 7    Body mass index is 40.32 kg/m.   General:  WDWN in NAD; vital signs documented above Gait: Not observed HENT: WNL, normocephalic Pulmonary: normal non-labored breathing without wheezing Cardiac: regular HR; without carotid bruits Abdomen: soft, NT, aortic pulse is not palpable Skin: without rashes Vascular Exam/Pulses:  Right Left  Radial 2+ (normal) 2+ (normal)  DP 2+ (normal) 2+ (normal)   Extremities: +BLE edema with pitting edema for ankle socks.   Neurologic: A&O X 3;  moving all extremities equally Psychiatric:  The pt has Normal affect.   Non-Invasive Vascular Imaging:   Venous duplex on 02/02/2024: Venous Reflux Times  +--------------+--------+------+----------+------------+-------------------  RIGHT        Reflux  Reflux  Reflux  Diameter cmsComments                          No       Yes     Time                                   +--------------+--------+------+----------+------------+-------------------  ----+  CFV                   yes  >1  second                                 +--------------+--------+------+----------+------------+-------------------  FV mid        no                                                       +--------------+--------+------+----------+------------+-------------------  Popliteal             yes  >1 second                                 +--------------+--------+------+----------+------------+-------------------  GSV at [facility]             yes   >500 ms      0.54                        +--------------+--------+------+----------+------------+-------------------  GSV prox thigh                                     prior ablation/stripping  +--------------+--------+------+----------+------------+-------------------  GSV mid thigh                                      prior ablation/stripping  +--------------+--------+------+----------+------------+-------------------  GSV dist thigh                                     prior ablation/stripping  +--------------+--------+------+----------+------------+-------------------   GSV at knee                                        prior  ablation/stripping not visualized      +--------------+--------+------+----------+------------+-------------------  SSV prox calf          yes   >500 ms      0.26                        +--------------+--------+------+----------+------------+-------------------  SSV mid calf           yes   >500 ms      0.24                        +--------------+--------+------+----------+------------+-------------------     +--------------+---------+------+-----------+------------+---------------+  LEFT         Reflux NoRefluxReflux TimeDiameter cmsComments                                 Yes                                          +--------------+---------+------+-----------+------------+---------------+  CFV  yes   >1 second                              +--------------+---------+------+-----------+------------+---------------+  FV mid        no                                                      +--------------+---------+------+-----------+------------+---------------+  Popliteal              yes   >1 second                              +--------------+---------+------+-----------+------------+---------------+  GSV at [facility]              yes    >500 ms      0.63                     +--------------+---------+------+-----------+------------+---------------+  GSV prox thigh          yes    >500 ms      0.64                     +--------------+---------+------+-----------+------------+---------------+  GSV mid thigh           yes    >500 ms      0.65                     +--------------+---------+------+-----------+------------+---------------+  GSV dist thigh          yes    >500 ms      0.59                     +--------------+---------+------+-----------+------------+---------------+  GSV at knee             yes    >500 ms      0.6                      +--------------+---------+------+-----------+------------+---------------+  GSV prox calf           yes                 0.46                     +--------------+---------+------+-----------+------------+---------------+  SSV at [facility]    no                            0.22    thigh extension  +--------------+---------+------+-----------+------------+---------------+  SSV prox calf                               0.31    image lost       +--------------+---------+------+-----------+------------+---------------+  SSV mid calf            yes    >500 ms      0.29                     +--------------+---------+------+-----------+------------+---------------+         Summary:  Right:  - No evidence of deep vein thrombosis seen  in the right lower extremity, from the common femoral through the popliteal veins.  - No evidence of superficial venous thrombosis in the right lower  extremity.   - Deep vein reflux in the CFV and popliteal vein.   - Superficial vein reflux in the  SFJ and the SSF.    Left:  - No evidence of deep vein thrombosis seen in the left lower extremity,  from the common femoral through the popliteal veins.  - No evidence of superficial venous thrombosis in the left lower  extremity.   - Deep vein reflux in the CFV and popliteal vein.   - Superficial vein reflux in the SFJ and GSV, and the SFJ mid calf.     Robert Wilkerson is a 58 y.o. male who presents with: BLE swelling, achiness and tiredness of his legs with hx of right leg vein stripping ablation about 30 years ago.     -pt has easily palpable DP pedal pulses bilaterally. -in the right lower extremity, the pt does not have evidence of DVT or SVT.  Pt does have venous reflux in the deep venous system.  He has hx of ablation/stripping in the past.   -in the left lower extremity, the pt does not have evidence of DVT/SVT.  Pt does have venous reflux in the deep venous system as well as the GSV at the [facility] throughout the thigh.  The vein measures 0.46cm to 0.65cm.   -discussed with pt about wearing thigh high 20-30 mmHg compression stockings and pt was measured for these today.   Discussed with him that he may be good candidate for LLE laser ablation.  Feel thigh high compression would also help his symptoms.   -discussed the importance of leg elevation and how to elevate properly - pt is advised to elevate their legs and a diagram is given to them to demonstrate for pt to lay flat on their back with knees elevated and slightly bent with their feet higher than their knees, which puts their feet higher than their heart for 15 minutes per day.  If pt cannot lay flat, advised to lay as flat as possible.  -pt is advised to continue as much walking as possible and avoid sitting or standing for long periods of time.  -discussed importance of weight loss and exercise and that water aerobics would also be beneficial.  -handout with recommendations given -pt will f/u 3 months with Dr. Serene or Dr. Sheree  for consideration of laser ablation.  At that time, we will get AAA screening since he does have family hx of AAA with grandparent.    Lucie Apt, [facility] [place] [facility] [phone]  Clinic MD:  Gretta     [1]  Allergies Allergen Reactions   Penicillins Hives    hives   Sulfa Antibiotics Nausea And Vomiting and Swelling    sick

## 2024-02-17 ENCOUNTER — Encounter: Payer: Self-pay | Admitting: Physician Assistant

## 2024-02-17 ENCOUNTER — Ambulatory Visit: Attending: Vascular Surgery

## 2024-02-17 VITALS — BP 120/82 | HR 87 | Temp 97.9°F | Ht 69.0 in | Wt 273.0 lb

## 2024-02-17 DIAGNOSIS — M7989 Other specified soft tissue disorders: Secondary | ICD-10-CM

## 2024-02-17 NOTE — Patient Instructions (Signed)

## 2024-02-18 ENCOUNTER — Other Ambulatory Visit: Payer: Self-pay

## 2024-02-18 ENCOUNTER — Other Ambulatory Visit (HOSPITAL_COMMUNITY): Payer: Self-pay

## 2024-02-18 DIAGNOSIS — M7989 Other specified soft tissue disorders: Secondary | ICD-10-CM

## 2024-02-23 ENCOUNTER — Other Ambulatory Visit (HOSPITAL_COMMUNITY): Payer: Self-pay

## 2024-02-23 MED ORDER — DIAZEPAM 5 MG PO TABS
5.0000 mg | ORAL_TABLET | Freq: Two times a day (BID) | ORAL | 0 refills | Status: DC | PRN
  Filled 2024-03-08: qty 60, 30d supply, fill #0

## 2024-02-23 MED ORDER — BUPRENORPHINE HCL 8 MG SL SUBL
8.0000 mg | SUBLINGUAL_TABLET | Freq: Two times a day (BID) | SUBLINGUAL | 0 refills | Status: DC
  Filled 2024-02-23: qty 60, 30d supply, fill #0

## 2024-02-23 MED ORDER — OXYCODONE HCL 20 MG PO TABS
20.0000 mg | ORAL_TABLET | Freq: Four times a day (QID) | ORAL | 0 refills | Status: DC | PRN
  Filled 2024-03-08: qty 120, 30d supply, fill #0

## 2024-02-27 ENCOUNTER — Other Ambulatory Visit (HOSPITAL_COMMUNITY): Payer: Self-pay

## 2024-03-02 ENCOUNTER — Other Ambulatory Visit: Payer: Self-pay

## 2024-03-02 ENCOUNTER — Other Ambulatory Visit (HOSPITAL_COMMUNITY): Payer: Self-pay

## 2024-03-02 MED ORDER — RYBELSUS 7 MG PO TABS
ORAL_TABLET | ORAL | 0 refills | Status: AC
  Filled 2024-04-03 (×2): qty 30, 30d supply, fill #0

## 2024-03-02 MED ORDER — RYBELSUS 3 MG PO TABS
ORAL_TABLET | ORAL | 0 refills | Status: AC
  Filled 2024-03-02: qty 30, 30d supply, fill #0

## 2024-03-08 ENCOUNTER — Other Ambulatory Visit (HOSPITAL_COMMUNITY): Payer: Self-pay

## 2024-03-25 ENCOUNTER — Other Ambulatory Visit (HOSPITAL_COMMUNITY): Payer: Self-pay

## 2024-03-25 MED ORDER — BUPRENORPHINE HCL 8 MG SL SUBL
8.0000 mg | SUBLINGUAL_TABLET | Freq: Two times a day (BID) | SUBLINGUAL | 0 refills | Status: AC
  Filled 2024-03-25: qty 60, 30d supply, fill #0

## 2024-03-26 ENCOUNTER — Other Ambulatory Visit (HOSPITAL_COMMUNITY): Payer: Self-pay

## 2024-03-26 MED ORDER — DIAZEPAM 5 MG PO TABS
5.0000 mg | ORAL_TABLET | Freq: Two times a day (BID) | ORAL | 0 refills | Status: AC | PRN
  Filled 2024-04-07 (×2): qty 60, 30d supply, fill #0

## 2024-03-26 MED ORDER — OXYCODONE HCL 20 MG PO TABS
20.0000 mg | ORAL_TABLET | Freq: Four times a day (QID) | ORAL | 0 refills | Status: AC | PRN
  Filled 2024-04-07 (×2): qty 120, 30d supply, fill #0

## 2024-03-31 ENCOUNTER — Other Ambulatory Visit: Payer: Self-pay

## 2024-03-31 ENCOUNTER — Other Ambulatory Visit (HOSPITAL_COMMUNITY): Payer: Self-pay

## 2024-03-31 MED ORDER — FUROSEMIDE 20 MG PO TABS
20.0000 mg | ORAL_TABLET | Freq: Every day | ORAL | 1 refills | Status: AC
  Filled 2024-03-31: qty 30, 30d supply, fill #0

## 2024-04-03 ENCOUNTER — Other Ambulatory Visit (HOSPITAL_COMMUNITY): Payer: Self-pay

## 2024-04-05 ENCOUNTER — Other Ambulatory Visit (HOSPITAL_COMMUNITY): Payer: Self-pay

## 2024-04-07 ENCOUNTER — Other Ambulatory Visit (HOSPITAL_COMMUNITY): Payer: Self-pay

## 2024-04-07 ENCOUNTER — Other Ambulatory Visit: Payer: Self-pay

## 2024-05-19 ENCOUNTER — Ambulatory Visit (HOSPITAL_COMMUNITY)

## 2024-05-19 ENCOUNTER — Ambulatory Visit: Admitting: Vascular Surgery
